# Patient Record
Sex: Female | Born: 1935 | Race: White | Hispanic: No | Marital: Married | Smoking: Never smoker
Health system: Southern US, Community
[De-identification: ages and names within clinical notes are randomized; demographics above are authoritative.]

## PROBLEM LIST (undated history)

## (undated) DIAGNOSIS — I10 Essential (primary) hypertension: Secondary | ICD-10-CM

## (undated) HISTORY — DX: Essential (primary) hypertension: I10

## (undated) HISTORY — PX: VAGINAL HYSTERECTOMY: SUR661

## (undated) HISTORY — PX: BREAST LUMPECTOMY: SHX2

## (undated) HISTORY — PX: TONSILLECTOMY: SUR1361

---

## 2007-03-12 ENCOUNTER — Ambulatory Visit: Payer: Self-pay | Admitting: Vascular Surgery

## 2007-03-25 ENCOUNTER — Ambulatory Visit: Payer: Self-pay | Admitting: Vascular Surgery

## 2007-06-17 ENCOUNTER — Ambulatory Visit: Payer: Self-pay | Admitting: Vascular Surgery

## 2007-09-09 ENCOUNTER — Ambulatory Visit: Payer: Self-pay | Admitting: Vascular Surgery

## 2008-02-18 ENCOUNTER — Ambulatory Visit: Payer: Self-pay | Admitting: Vascular Surgery

## 2011-04-01 NOTE — Procedures (Signed)
LOWER EXTREMITY VENOUS REFLUX EXAM   INDICATION:  Bilateral lower extremity varicose veins.  Patient has had  treatment for small spider veins in the past.   EXAM:  Using color-flow imaging and pulse Doppler spectral analysis, the  right and left common femoral, superficial femoral, popliteal, posterior  tibial, greater and lesser saphenous veins are evaluated.  There is no  evidence suggesting deep venous insufficiency in the right or left lower  extremity.   The right and left saphenofemoral junctions are competent.   The right and left GSV are competent.   The left proximal short saphenous vein demonstrates incompetency.  Diameter measurements range from 0.4 cm at the saphenopopliteal junction  to 0.24 cm at the mid-calf where the vessel sub-divides into very small  branches.   GSV Diameter (used if found to be incompetent only)                                            Right    Left  Proximal Greater Saphenous Vein           cm       cm  Proximal-to-mid-thigh                     cm       cm  Mid thigh                                 cm       cm  Mid-distal thigh                          cm       cm  Distal thigh                              cm       cm  Knee                                      cm       cm   IMPRESSION:  1. Left small saphenous vein reflux is identified with the caliber      ranging from 0.4 cm to 0.24 cm from the knee to the mid-calf.  2. The right and left greater saphenous veins are not aneurysmal.  3. The right and left greater saphenous veins are not tortuous.  4. The deep venous system is competent.  5. The left lesser saphenous vein is not competent.  6. There is a small incompetent tributary branch at the mid-thigh      level of the left greater saphenous vein.  The remaining left      greater saphenous vein proximal and distal to this incompetent      tributary branch are competent.   ___________________________________________  Larina Earthly, M.D.   MC/MEDQ  D:  02/18/2008  T:  02/18/2008  Job:  [id number]

## 2011-04-01 NOTE — Assessment & Plan Note (Signed)
OFFICE VISIT   ICYSS, SKOG  DOB:  03/12/1935                                       02/18/2008  [id number]#:[id number]   Patient presents today for continued discussion and concern regarding  symptoms in her right leg.  She reports a feeling of heaviness and  occasional tingling in the lateral calf.  She reports some sensation of  heaviness associated with this as well.  She does have known sciatic or  disk discomfort and is being treated with this.  Feels that it may be  related to this.  She has seen me today to rule out any venous  incompetence or venous hypertension that is causing her leg pain.  I  have seen her initially for consultation one year ago.  At that time, I  had done a cursory venous duplex, and this did not show any evidence of  obvious saphenous vein reflux.  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  She has not  had a good result from her sclerotherapy with some persistence of  treated veins and also some hemosiderin staining.  She has not had any  history of deep venous thrombosis or other major venous pathology.   She did undergo a formal duplex today, and this showed no evidence of  occlusion or incompetence in her deep system veins bilaterally.  Also,  her greater saphenous vein was normal without incompetence.  She did  have incompetence and reflux demonstrated in her left small saphenous  vein.  I discussed this with Ms. Calvillo and explained that she is not  having any symptoms related to this and therefore does not require any  treatment.  I am unclear of this etiology of her right leg symptoms but  are convinced that this is not related to venous pathology.  She has  normal 2+ dorsalis pedis pulses bilaterally.  I also explained this  indicates normal arterial flow.  She will see [place] again on an as-needed  basis.   Larina Earthly, M.D.  Electronically Signed   TFE/MEDQ  D:   02/18/2008  T:  02/21/2008  Job:  [id number]   cc:   Dr. Derald Macleod, Liz Malady

## 2014-02-22 ENCOUNTER — Other Ambulatory Visit: Payer: Self-pay | Admitting: Physical Medicine and Rehabilitation

## 2014-02-22 DIAGNOSIS — M549 Dorsalgia, unspecified: Secondary | ICD-10-CM

## 2014-02-23 ENCOUNTER — Ambulatory Visit
Admission: RE | Admit: 2014-02-23 | Discharge: 2014-02-23 | Disposition: A | Discharge status: Home / Self Care | Payer: Medicare Other | Source: Ambulatory Visit | Attending: Physical Medicine and Rehabilitation | Admitting: Physical Medicine and Rehabilitation

## 2014-02-23 VITALS — BP 155/66 | HR 75

## 2014-02-23 DIAGNOSIS — M549 Dorsalgia, unspecified: Secondary | ICD-10-CM

## 2014-02-23 DIAGNOSIS — M5126 Other intervertebral disc displacement, lumbar region: Secondary | ICD-10-CM

## 2014-02-23 MED ORDER — IOHEXOL 180 MG/ML  SOLN
1.0000 mL | Freq: Once | INTRAMUSCULAR | Status: AC | PRN
  Administered 2014-02-23: 1 mL via EPIDURAL

## 2014-02-23 MED ORDER — METHYLPREDNISOLONE ACETATE 40 MG/ML INJ SUSP (RADIOLOG
120.0000 mg | Freq: Once | INTRAMUSCULAR | Status: AC
  Administered 2014-02-23: 120 mg via EPIDURAL

## 2014-02-23 NOTE — Discharge Instructions (Signed)

## 2022-08-15 ENCOUNTER — Emergency Department (HOSPITAL_BASED_OUTPATIENT_CLINIC_OR_DEPARTMENT_OTHER)
Admission: EM | Admit: 2022-08-15 | Discharge: 2022-08-15 | Disposition: A | Discharge status: Home / Self Care | Payer: Medicare Other | Attending: Emergency Medicine | Admitting: Emergency Medicine

## 2022-08-15 ENCOUNTER — Emergency Department (HOSPITAL_BASED_OUTPATIENT_CLINIC_OR_DEPARTMENT_OTHER): Payer: Medicare Other | Admitting: Radiology

## 2022-08-15 ENCOUNTER — Encounter (HOSPITAL_BASED_OUTPATIENT_CLINIC_OR_DEPARTMENT_OTHER): Payer: Self-pay | Admitting: Emergency Medicine

## 2022-08-15 DIAGNOSIS — R531 Weakness: Secondary | ICD-10-CM | POA: Diagnosis not present

## 2022-08-15 DIAGNOSIS — R059 Cough, unspecified: Secondary | ICD-10-CM | POA: Diagnosis not present

## 2022-08-15 DIAGNOSIS — R5383 Other fatigue: Secondary | ICD-10-CM | POA: Diagnosis not present

## 2022-08-15 DIAGNOSIS — R42 Dizziness and giddiness: Secondary | ICD-10-CM | POA: Diagnosis not present

## 2022-08-15 LAB — BASIC METABOLIC PANEL
Anion gap: 11 (ref 5–15)
BUN: 16 mg/dL (ref 8–23)
CO2: 24 mmol/L (ref 22–32)
Calcium: 9.4 mg/dL (ref 8.9–10.3)
Chloride: 94 mmol/L — ABNORMAL LOW (ref 98–111)
Creatinine, Ser: 0.7 mg/dL (ref 0.44–1.00)
GFR, Estimated: >60 mL/min (ref >60)
Glucose, Bld: 108 mg/dL — ABNORMAL HIGH (ref 70–99)
Potassium: 3.5 mmol/L (ref 3.5–5.1)
Sodium: 129 mmol/L — ABNORMAL LOW (ref 135–145)

## 2022-08-15 LAB — CBC
HCT: 33.8 % — ABNORMAL LOW (ref 36.0–46.0)
Hemoglobin: 11.3 g/dL — ABNORMAL LOW (ref 12.0–15.0)
MCH: 29.8 pg (ref 26.0–34.0)
MCHC: 33.4 g/dL (ref 30.0–36.0)
MCV: 89.2 fL (ref 80.0–100.0)
Platelets: 291 10*3/uL (ref 150–400)
RBC: 3.79 MIL/uL — ABNORMAL LOW (ref 3.87–5.11)
RDW: 13.4 % (ref 11.5–15.5)
WBC: 6.8 10*3/uL (ref 4.0–10.5)
nRBC: 0 % (ref 0.0–0.2)

## 2022-08-15 LAB — URINALYSIS, ROUTINE W REFLEX MICROSCOPIC
Bilirubin Urine: NEGATIVE
Glucose, UA: NEGATIVE mg/dL
Hgb urine dipstick: NEGATIVE
Ketones, ur: NEGATIVE mg/dL
Nitrite: NEGATIVE
Protein, ur: NEGATIVE mg/dL
Specific Gravity, Urine: 1.01 (ref 1.005–1.030)
pH: 7 (ref 5.0–8.0)

## 2022-08-15 LAB — TROPONIN I (HIGH SENSITIVITY): Troponin I (High Sensitivity): 6 ng/L (ref <18)

## 2022-08-15 MED ORDER — SODIUM CHLORIDE 0.9 % IV BOLUS
500.0000 mL | Freq: Once | INTRAVENOUS | Status: AC
  Administered 2022-08-15: 500 mL via INTRAVENOUS

## 2022-08-15 MED ORDER — CEPHALEXIN 500 MG PO CAPS: 500.0000 mg | ORAL_CAPSULE | Freq: Two times a day (BID) | ORAL | 0 refills | Status: AC

## 2022-08-15 NOTE — ED Triage Notes (Signed)
Has been treated for UTI since Tuesday. C/o ongoing generalized weakness, decreased PO intake, bladder pressure.

## 2022-08-15 NOTE — ED Provider Notes (Signed)
[facility] EMERGENCY DEPT Provider Note   CSN: [id number] Arrival date & time: 08/15/22  1812     History  Chief Complaint  Patient presents with   Weakness    Terri Sellers is a 87 y.o. female.  HPI 87 year old female presents with progressive weakness and fatigue for about 3 weeks.  Earlier this week about 3 days ago she was put on Macrobid for a UTI.  She has had some nonspecific urinary symptoms and some bladder "pressure".  However her weakness and fatigue started before that.  She does describe a nonproductive cough but no shortness of breath, chest pain or headache.  No focal weakness but just feels tired.  Sometimes she feels lightheaded.  She has been taking the Macrobid but is concerned is not helping and she may need a different antibiotic.  Home Medications Prior to Admission medications   Medication Sig Start Date End Date Taking? Authorizing Provider  cephALEXin (KEFLEX) 500 MG capsule Take 1 capsule (500 mg total) by mouth 2 (two) times daily for 5 days. 08/15/22 08/20/22 Yes Pricilla Loveless, MD      Allergies    Patient has no known allergies.    Review of Systems   Review of Systems  Constitutional:  Positive for fatigue. Negative for fever.  Respiratory:  Positive for cough. Negative for shortness of breath.   Cardiovascular:  Negative for chest pain.  Gastrointestinal:  Negative for abdominal pain and vomiting.  Genitourinary:  Negative for dysuria.  Neurological:  Positive for weakness.    Physical Exam Updated Vital Signs BP 115/63   Pulse 80   Temp 98.2 F (36.8 C)   Resp 18   Ht 5\' 2"  (1.575 m)   Wt 58.1 kg   SpO2 100%   BMI 23.41 kg/m  Physical Exam Vitals and nursing note reviewed.  Constitutional:      Appearance: She is well-developed.  HENT:     Head: Normocephalic and atraumatic.  Eyes:     Extraocular Movements: Extraocular movements intact.     Pupils: Pupils are equal, round, and reactive to light.   Cardiovascular:     Rate and Rhythm: Normal rate and regular rhythm.     Heart sounds: Normal heart sounds.  Pulmonary:     Effort: Pulmonary effort is normal.     Breath sounds: Normal breath sounds. No wheezing, rhonchi or rales.  Abdominal:     General: There is no distension.     Palpations: Abdomen is soft.     Tenderness: There is no abdominal tenderness. There is no right CVA tenderness or left CVA tenderness.  Skin:    General: Skin is warm and dry.  Neurological:     Mental Status: She is alert.     Comments: CN 3-12 grossly intact. 5/5 strength in all 4 extremities. Grossly normal sensation.      ED Results / Procedures / Treatments   Labs (all labs ordered are listed, but only abnormal results are displayed) Labs Reviewed  BASIC METABOLIC PANEL - Abnormal; Notable for the following components:      Result Value   Sodium 129 (*)    Chloride 94 (*)    Glucose, Bld 108 (*)    All other components within normal limits  CBC - Abnormal; Notable for the following components:   RBC 3.79 (*)    Hemoglobin 11.3 (*)    HCT 33.8 (*)    All other components within normal limits  URINALYSIS, ROUTINE W REFLEX  MICROSCOPIC - Abnormal; Notable for the following components:   Leukocytes,Ua LARGE (*)    All other components within normal limits  URINE CULTURE  TROPONIN I (HIGH SENSITIVITY)    EKG EKG Interpretation  Date/Time:  Friday August 15 2022 18:51:19 EDT Ventricular Rate:  70 PR Interval:  168 QRS Duration: 74 QT Interval:  376 QTC Calculation: 406 R Axis:   44 Text Interpretation: Normal sinus rhythm Nonspecific ST abnormality No old tracing to compare Confirmed by Sherwood Gambler [id number]) on 08/15/2022 7:13:39 PM  Radiology DG Chest 2 View  Result Date: 08/15/2022 CLINICAL DATA:  Cough and weakness EXAM: CHEST - 2 VIEW COMPARISON:  None Available. FINDINGS: Normal cardiomediastinal silhouette. Aortic atherosclerotic calcification. No focal consolidation,  pleural effusion, or pneumothorax. Mild coarsening of the interstitium, favored chronic. No acute osseous abnormality. IMPRESSION: No active cardiopulmonary disease. Electronically Signed   By: Placido Sou M.D.   On: 08/15/2022 21:48    Procedures Procedures    Medications Ordered in ED Medications  sodium chloride 0.9 % bolus 500 mL (500 mLs Intravenous New Bag/Given 08/15/22 2153)    ED Course/ Medical Decision Making/ A&P                           Medical Decision Making Amount and/or Complexity of Data Reviewed External Data Reviewed: labs.    Details: UA with UTI but urine culture negative. Mild anemia on CBC, mild hyponatremia Labs: ordered.    Details: Mild anemia but not that different from a few days ago at PCP.  Mild hyponatremia that slightly worse than a couple days ago but not bad at 129. Radiology: ordered and independent interpretation performed.    Details: No pneumonia ECG/medicine tests: independent interpretation performed.    Details: Nonspecific ST waves but no old to compare to.  No acute ischemia.  Risk Prescription drug management.   Unclear why the patient has been progressively weak for several weeks.  Her sodium tends to run mildly low and is not that different today to make me suspect this is why she is feeling weak.  Could be urinary tract infection though its not been improved with Macrobid.  She would like to change to different antibiotics which I do not think is unreasonable.  She is feeling a little better with IV fluids.  We will try Keflex instead of Macrobid though with no CVA tenderness, normal WBC, my suspicion of pyelonephritis is pretty low.  I discussed that the urine may not be the cause of the weakness and she should still follow-up with PCP.  There is no concern for stroke on my exam given no unilateral weakness.  Her ECG is nonspecific but with no old to compare to.  Given age troponin sent but is negative.  Doubt this is from cardiac  cause.  No pneumonia/CHF on chest x-ray.  Will discharge home with return precautions.        Final Clinical Impression(s) / ED Diagnoses Final diagnoses:  Generalized weakness    Rx / DC Orders ED Discharge Orders          Ordered    cephALEXin (KEFLEX) 500 MG capsule  2 times daily        08/15/22 2320              Sherwood Gambler, MD 08/15/22 2349

## 2022-08-15 NOTE — ED Notes (Signed)
Pt care taken, resting awaiting md, did ambulate to bathroom, describes feeling weak this week since being diagnosed with an uti.

## 2022-08-15 NOTE — Discharge Instructions (Signed)
We are changing up your antibiotic as it may be that this is from a urinary tract infection.  Be sure to follow-up with her primary care physician next week as scheduled.  Follow-up new or worsening weakness, especially weakness on the one side, headache, chest pain, or any other new/concerning symptoms then return to the ER for eval patient.

## 2022-08-17 LAB — URINE CULTURE: Culture: NO GROWTH

## 2022-09-10 ENCOUNTER — Ambulatory Visit (INDEPENDENT_AMBULATORY_CARE_PROVIDER_SITE_OTHER): Payer: Medicare Other | Admitting: Neurology

## 2022-09-10 ENCOUNTER — Encounter: Payer: Self-pay | Admitting: Neurology

## 2022-09-10 VITALS — BP 142/78 | HR 80 | Ht 61.0 in | Wt 127.8 lb

## 2022-09-10 DIAGNOSIS — F419 Anxiety disorder, unspecified: Secondary | ICD-10-CM

## 2022-09-10 DIAGNOSIS — R208 Other disturbances of skin sensation: Secondary | ICD-10-CM | POA: Diagnosis not present

## 2022-09-10 DIAGNOSIS — G629 Polyneuropathy, unspecified: Secondary | ICD-10-CM

## 2022-09-10 MED ORDER — LIDOCAINE 5 % EX OINT: 1.0000 | TOPICAL_OINTMENT | CUTANEOUS | 3 refills | Status: AC | PRN

## 2022-09-10 MED ORDER — LAMOTRIGINE 25 MG PO TABS: ORAL_TABLET | ORAL | 3 refills | Status: AC

## 2022-09-10 NOTE — Progress Notes (Signed)
[facility]  PATIENT: Terri Sellers DOB: 12/22/1934  REFERRING DOCTOR OR PCP: Eber Hong, MD SOURCE: Patient, notes from primary care, laboratory tests  _________________________________   HISTORICAL  CHIEF COMPLAINT:  Chief Complaint  Patient presents with   Follow-up    RM 1 w/ family. Paper referral for neuropathy.     HISTORY OF PRESENT ILLNESS:  I had the pleasure to see your patient, Terri Sellers, at [facility] for neurologic consultation regarding her leg dysesthesias.  She is an 87 yo woman who has had numbness and pain in her feet the past few months.  She has had mild stingling in her feet that was tolerable the past 1-2 years.  Symptoms worsened 5-6 weeks ago with burning stinging dysesthesias.    These are worse at night than during the day and will go up to mid shin when more intense.      She noted some skin changes with bleeding 5-6 weeks ago and had an antibiotic called in.    She was referred to a vein specialist due to the blood spurt and a doppler ultrasound was ordered that was reportedly normal.   She also saw a dermatologist  and was told to use Eucerin lotion.     She feels her gait is unchanged but balance is a little worse.  Symptoms have not worsened further the last few weeks.    Gabapentin was poorly tolerated.   C[place] increased BP.     She has had dry eyes and dry mouth, worse this year.    She ha had a lot of anxiety and was started on buspirone.  Anxiety worsened in association with her leg pain.  . She reports her BP has been fluctuating more.  Labs 08/14/2022 was unremarkable excep low sodium 131.   B12 was normal.    She reports a lupus lab is pending.      REVIEW OF SYSTEMS: Constitutional: No fevers, chills, sweats, or change in appetite Eyes: No visual changes, double vision, eye pain Ear, nose and throat: No hearing loss, ear pain, nasal congestion, sore throat Cardiovascular: No chest pain,  palpitations Respiratory:  No shortness of breath at rest or with exertion.   No wheezes GastrointestinaI: No nausea, vomiting, diarrhea, abdominal pain, fecal incontinence Genitourinary:  No dysuria, urinary retention or frequency.  No nocturia. Musculoskeletal:  No neck pain, back pain Integumentary: No rash, pruritus, skin lesions Neurological: as above Psychiatric: No depression at this time.  No anxiety Endocrine: No palpitations, diaphoresis, change in appetite, change in weigh or increased thirst Hematologic/Lymphatic:  No anemia, purpura, petechiae. Allergic/Immunologic: No itchy/runny eyes, nasal congestion, recent allergic reactions, rashes  ALLERGIES: Allergies  Allergen Reactions   Latex Itching, Other (See Comments) and Rash   Duloxetine     Felt bad    Lisinopril Other (See Comments)    cough cough cough cough cough cough cough cough     HOME MEDICATIONS:  Current Outpatient Medications:    acetaminophen (TYLENOL) 500 MG tablet, Take 500 mg by mouth every 6 (six) hours as needed., Disp: , Rfl:    betamethasone dipropionate 0.05 % lotion, Apply topically 2 (two) times daily., Disp: , Rfl:    brimonidine (ALPHAGAN) 0.2 % ophthalmic solution, Place 1 drop into both eyes 3 (three) times daily., Disp: , Rfl:    busPIRone (BUSPAR) 5 MG tablet, Take 5 mg by mouth 3 (three) times daily., Disp: , Rfl:    Calcium Carbonate Antacid (CALCIUM CARBONATE PO),  Take 1 tablet by mouth daily., Disp: , Rfl:    calcium-vitamin D (OSCAL WITH D) 500-5 MG-MCG tablet, Take 1 tablet by mouth daily with breakfast., Disp: , Rfl:    diclofenac Sodium (VOLTAREN) 1 % GEL, Apply 1 Application topically as needed (For back pain)., Disp: , Rfl:    dorzolamide (TRUSOPT) 2 % ophthalmic solution, Place 1 drop into both eyes 3 (three) times daily., Disp: , Rfl:    Ergocalciferol (VITAMIN D2 PO), Take 50,000 Units by mouth once a week., Disp: , Rfl:    famotidine (PEPCID) 20 MG tablet, Take 20 mg by  mouth 2 (two) times daily., Disp: , Rfl:    fluticasone (FLONASE) 50 MCG/ACT nasal spray, Place 1 spray into both nostrils daily., Disp: , Rfl:    GLUCOSAMINE-CHONDROITIN PO, Take 1 Capful by mouth daily., Disp: , Rfl:    hydrochlorothiazide (HYDRODIURIL) 25 MG tablet, Take 25 mg by mouth daily., Disp: , Rfl:    ketoconazole (NIZORAL) 2 % cream, Apply 1 Application topically daily as needed for irritation., Disp: , Rfl:    lamoTRIgine (LAMICTAL) 25 MG tablet, Take 1 po qd x 5d, then 1 po bid x 5 d, then 1 po tid x 5 d, then 2 po bid, Disp: 120 tablet, Rfl: 3   latanoprost (XALATAN) 0.005 % ophthalmic solution, Place 1 drop into both eyes at bedtime., Disp: , Rfl:    lidocaine (XYLOCAINE) 5 % ointment, Apply 1 Application topically as needed. Rub one inch into the affected skin twice daily, Disp: 35.44 g, Rfl: 3   losartan (COZAAR) 25 MG tablet, Take 50 mg by mouth daily., Disp: , Rfl:    Multiple Vitamins-Minerals (PRESERVISION AREDS 2 PO), Take 1 tablet by mouth 2 (two) times daily., Disp: , Rfl:    nystatin ointment (MYCOSTATIN), Apply 1 Application topically 3 (three) times daily., Disp: , Rfl:    Omega-3 Fatty Acids (OMEGA 3 PO), Take 1 Capful by mouth daily. Salmon oil, Disp: , Rfl:    Polyethyl Glycol-Propyl Glycol (SYSTANE ULTRA OP), Apply 1 drop to eye in the morning and at bedtime., Disp: , Rfl:    POTASSIUM PO, Take 1 tablet by mouth daily., Disp: , Rfl:    pyridoxine (B-6) 100 MG tablet, Take 100 mg by mouth daily., Disp: , Rfl:    Red Yeast Rice Extract (RED YEAST RICE PO), Take 1 tablet by mouth daily., Disp: , Rfl:    triamcinolone lotion (KENALOG) 0.1 %, Apply 1 Application topically 3 (three) times daily., Disp: , Rfl:    Turmeric, Curcuma Longa, (TURMERIC ROOT) POWD, Take 1 capsule by mouth daily., Disp: , Rfl:   PAST MEDICAL HISTORY: Past Medical History:  Diagnosis Date   Hypertension     PAST SURGICAL HISTORY: Past Surgical History:  Procedure Laterality Date   BREAST  LUMPECTOMY Right    TONSILLECTOMY     VAGINAL HYSTERECTOMY      FAMILY HISTORY: Family History  Problem Relation Age of Onset   Lymphoma Mother    Heart attack Father     SOCIAL HISTORY: Social History   Socioeconomic History   Marital status: Married    Spouse name: Not on file   Number of children: Not on file   Years of education: Not on file   Highest education level: Not on file  Occupational History   Not on file  Tobacco Use   Smoking status: Never   Smokeless tobacco: Never  Substance and Sexual Activity   Alcohol use:  Never   Drug use: Never   Sexual activity: Not on file  Other Topics Concern   Not on file  Social History Narrative   Right handed   1/2 cup coffee per day, 1 soda per week, 8oz tea every day w/ dinner   Social Determinants of Health   Financial Resource Strain: Not on file  Food Insecurity: Not on file  Transportation Needs: Not on file  Physical Activity: Not on file  Stress: Not on file  Social Connections: Not on file  Intimate Partner Violence: Not on file       PHYSICAL EXAM  Vitals:   09/10/22 1358 09/10/22 1449  BP: (!) 171/82 (!) 142/78  Pulse: 80   Weight: 127 lb 12.8 oz (58 kg)   Height: '5\' 1"'  (1.549 m)     Body mass index is 24.15 kg/m.   General: The patient is well-developed and well-nourished and in no acute distress  HEENT:  Head is [place]/AT.  Sclera are anicteric.    Neck: No carotid bruits are noted.  The neck is nontender.  Cardiovascular: The heart has a regular rate and rhythm with a normal S1 and S2. There were no murmurs, gallops or rubs.    Skin: She has mild erythema in the legs.  No sores.  Musculoskeletal:  Back is nontender  Neurologic Exam  Mental status: The patient is alert and oriented x 3 at the time of the examination. The patient has apparent normal recent and remote memory, with an apparently normal attention span and concentration ability.   Speech is normal.  Cranial nerves:  Extraocular movements are full. Facial symmetry is present. There is good facial sensation to soft touch bilaterally.Facial strength is normal.  Trapezius and sternocleidomastoid strength is normal. No dysarthria is noted.  The tongue is midline, and the patient has symmetric elevation of the soft palate. No obvious hearing deficits are noted.  Motor:  Muscle bulk is normal.   Tone is normal. Strength is  5 / 5 in all 4 extremities.   Sensory: Sensory testing is intact to pinprick, soft touch and vibration sensation in the arms and proximal legs.  She had normal pinprick sensation except for the toe tips that were mildly reduced.  Vibration sensation was about 50% at the toes but normal at the ankles..  Coordination: Cerebellar testing reveals good finger-nose-finger and heel-to-shin bilaterally.  Gait and station: Station is normal.   Gait is normal. Tandem gait is normal. Romberg is negative.   Reflexes: Deep tendon reflexes are symmetric and normal bilaterally.   Plantar responses are flexor.    DIAGNOSTIC DATA (LABS, IMAGING, TESTING) - I reviewed patient records, labs, notes, testing and imaging myself where available.  Lab Results  Component Value Date   WBC 6.8 08/15/2022   HGB 11.3 (L) 08/15/2022   HCT 33.8 (L) 08/15/2022   MCV 89.2 08/15/2022   PLT 291 08/15/2022      Component Value Date/Time   NA 129 (L) 08/15/2022 1853   K 3.5 08/15/2022 1853   CL 94 (L) 08/15/2022 1853   CO2 24 08/15/2022 1853   GLUCOSE 108 (H) 08/15/2022 1853   BUN 16 08/15/2022 1853   CREATININE 0.70 08/15/2022 1853   CALCIUM 9.4 08/15/2022 1853   GFRNONAA >60 08/15/2022 1853        ASSESSMENT AND PLAN  Polyneuropathy - Plan: Sjogren's syndrome antibods(ssa + ssb), Multiple Myeloma Panel (SPEP&IFE w/QIG), Anti-Hu, Anti-Ri, Anti-Yo IFA, Rheumatoid factor, Copper, serum, CANCELED: Cyclic citrul  peptide antibody, IgG  Dysesthesia - Plan: Sjogren's syndrome antibods(ssa + ssb), Multiple  Myeloma Panel (SPEP&IFE w/QIG), Anti-Hu, Anti-Ri, Anti-Yo IFA, Rheumatoid factor, Copper, serum, CANCELED: Cyclic citrul peptide antibody, IgG  Anxiety  In summary, Ms. Jacuinde is an 87 year old woman with painful dysesthesias in both legs that have developed over the last 6 weeks.  On exam, she has just mild reduced vibration sensation and fairly intact pinprick sensation.  The etiology is not uncertain but she likely has a mild polyneuropathy.  We will check some blood work for autoimmune and metabolic causes of painful neuropathies.  To help with the dysesthesias, I will start lamotrigine and titrate up to 50 mg p.o. twice daily.  She also has anxiety and this may help a bit with that as well.    She will return to see [place] in 3 to 4 months or sooner if there are new or worsening neurologic symptoms or based on the results of the studies.  If symptoms do not improve and an etiology is not discovered with laboratory testing, we will arrange for her to have an NCV/EMG to further evaluate.  Thank you for asking me to see Ms. Darwin.  Please let me know if I can be of further assistance with her or other patients in the future.  Lukisha Procida A. Felecia Shelling, MD, [facility] 00/51/1021, 1:17 PM Certified in Neurology, Clinical Neurophysiology, Sleep Medicine and Neuroimaging  [facility] [address], [place], [place] [address] [phone]

## 2022-09-10 NOTE — Patient Instructions (Signed)
The pharmacy has the prescription of lamotrigine 25 mg.   Please take it as follows: For 5 days take 1 pill once a day. The next 5 days, take 1 pill twice a day. The next 5 days, take 1 pill in the morning and 2 at bedtime or evening. Then take 2 pills twice a day.  Most people tolerate lamotrigine very well.  If you get a rash stop the medicine immediately and let us know.

## 2022-09-11 ENCOUNTER — Other Ambulatory Visit (INDEPENDENT_AMBULATORY_CARE_PROVIDER_SITE_OTHER): Payer: Self-pay

## 2022-09-11 ENCOUNTER — Telehealth: Payer: Self-pay | Admitting: *Deleted

## 2022-09-11 DIAGNOSIS — Z0289 Encounter for other administrative examinations: Secondary | ICD-10-CM

## 2022-09-11 NOTE — Telephone Encounter (Signed)
PA approved 11/17/21-12/10/22.

## 2022-09-11 NOTE — Telephone Encounter (Signed)
PA lidocaine submitted on CMM. Key: [id number]. Waiting on determination from [facility].

## 2022-09-15 ENCOUNTER — Telehealth: Payer: Self-pay | Admitting: Neurology

## 2022-09-15 NOTE — Telephone Encounter (Signed)
Pt is calling. Stated she would like a nurse to call her with lab results. Pt is requesting a call back from nurse.

## 2022-09-15 NOTE — Telephone Encounter (Signed)
Called pt back. Advised a lot of her labs are still pending. Once all are back and MD reviews, we will call her. She verbalized understanding.

## 2022-09-22 LAB — MULTIPLE MYELOMA PANEL, SERUM
Albumin SerPl Elph-Mcnc: 3.9 g/dL (ref 2.9–4.4)
Albumin/Glob SerPl: 1.4 (ref 0.7–1.7)
Alpha 1: 0.3 g/dL (ref 0.0–0.4)
Alpha2 Glob SerPl Elph-Mcnc: 0.8 g/dL (ref 0.4–1.0)
B-Globulin SerPl Elph-Mcnc: 1.1 g/dL (ref 0.7–1.3)
Gamma Glob SerPl Elph-Mcnc: 0.8 g/dL (ref 0.4–1.8)
Globulin, Total: 3 g/dL (ref 2.2–3.9)
IgA/Immunoglobulin A, Serum: 231 mg/dL (ref 64–422)
IgG (Immunoglobin G), Serum: 923 mg/dL (ref 586–1602)
IgM (Immunoglobulin M), Srm: 52 mg/dL (ref 26–217)
Total Protein: 6.9 g/dL (ref 6.0–8.5)

## 2022-09-22 LAB — ANTI-HU, ANTI-RI, ANTI-YO IFA
Anti-Hu Ab: NEGATIVE
Anti-Ri Ab: NEGATIVE
Anti-Yo Ab: NEGATIVE

## 2022-09-22 LAB — RHEUMATOID FACTOR: Rheumatoid fact SerPl-aCnc: <10 IU/mL (ref <14.0)

## 2022-09-22 LAB — SJOGREN'S SYNDROME ANTIBODS(SSA + SSB)
ENA SSA (RO) Ab: <0.2 AI (ref 0.0–0.9)
ENA SSB (LA) Ab: <0.2 AI (ref 0.0–0.9)

## 2022-09-22 LAB — COPPER, SERUM: Copper: 65 ug/dL — ABNORMAL LOW (ref 80–158)

## 2022-09-23 ENCOUNTER — Telehealth: Payer: Self-pay | Admitting: Neurology

## 2022-09-23 NOTE — Telephone Encounter (Signed)
Called the patient and advised the pt of the lab results. Advised the copper level was low and that Dr Felecia Shelling would like for her to take 2 mg Copper twice a day. Advised if she has completed this for a month and the symptoms are not better she can let us know and we can consider next steps like MRI or ncv/emg. Pt asked a copy of this be sent to her PCP. Pt verbalized understanding. Pt had no questions at this time but was encouraged to call back if questions arise.

## 2022-09-23 NOTE — Telephone Encounter (Signed)
-----   Message from Britt Bottom, MD sent at 09/22/2022  4:50 PM EST ----- Copper was low.  She should take 2 mg of copper gluconate twice a day (should be able to get at drug stores or on [place]).  If not better after a month or so we would need to check an NCV/EMG and may be an MRI of the cervical spine

## 2022-09-24 ENCOUNTER — Other Ambulatory Visit: Payer: Self-pay | Admitting: Neurology

## 2022-09-24 ENCOUNTER — Telehealth: Payer: Self-pay | Admitting: Neurology

## 2022-09-24 MED ORDER — HYDROXYZINE HCL 10 MG PO TABS: 10.0000 mg | ORAL_TABLET | Freq: Three times a day (TID) | ORAL | 1 refills | Status: AC | PRN

## 2022-09-24 NOTE — Telephone Encounter (Signed)
Called pt. Relayed Dr. Bonnita Hollow recommendation. She is agreeable to try this. Went over instructions. She read back instructions correctly. Aware it was sent into [facility] for her. She will call back if this does not help.

## 2022-09-24 NOTE — Telephone Encounter (Signed)
Pt is asking for a lotion of some kind to be called in for the itching and burning of her legs and face

## 2022-09-24 NOTE — Telephone Encounter (Signed)
Called pt back. She is having severe itching/burning in her feet up to her knees, in her face and some in her arms.  She uses about 1in topically of the lidocaine ointment prescribed by Dr. Epimenio Foot on her toes/feet. Does not have enough to use on rest of body.  Face also has a rough texture to her skin.  Nose swollen/forehead puffy. She confirmed she ordered copper from [place] and should arrive today for her to start. She has also tried Sarna cream but ineffective. Trying cold compress/ineffective. Not able to sleep d/t pain. Dr. Maryellen Pile gave her lorazepam 0.5mg  that she takes at bedtime. But she says if she wakes up she cannot go back to sleep. She is supposed to move to [place] this Friday. Unsure if she can do this move if she continues feeling this way.  Aware I will send to MD to see if there is something else he can add to help with sx. Aware I will call back once I hear from Dr. Epimenio Foot.

## 2022-10-07 ENCOUNTER — Other Ambulatory Visit: Payer: Self-pay | Admitting: Neurology

## 2022-10-07 ENCOUNTER — Telehealth: Payer: Self-pay | Admitting: Neurology

## 2022-10-07 NOTE — Telephone Encounter (Signed)
Called pt back. Relayed Dr. Bonnita Hollow recommendation. She has already tried/failed gabapentin (poorly tolerated) and Cymbalta (increased BP). Yesterday, she started taking Buspirone 5mg  , 1 tablet po TID as needed for anxiety. She is not taking Sertraline 25mg . This made her too groggy/dizzy.  She wants to know if there is another topical cream she can use besides Sarna lotion for burning pain/stinging for legs to give copper more time to be effective over the next month.

## 2022-10-07 NOTE — Telephone Encounter (Signed)
Pt is calling. Stated the outside of her legs are burning and dry. Stated she needs something called in to the pharmacy. Pt is requesting a call back

## 2022-10-07 NOTE — Telephone Encounter (Signed)
Called and spoke w/ pt. From lower legs down to feet, she is having burning pain. Skin is very rough/dry. She is using Sarna lotion suggested by dermatologist but ineffective. She has a piece of skin peeling off. Also noticed red spots on lower legs.  Confirmed she is taking hydroxyzine 10mg , copper 2mg  (started this 09/26/22). Hydroxyzine only helped for first 2-3 days then sx returned. Wondering if there is something else that can help her sx or if Dr. thinks she should go back to dermatologist at this point?   Relayed it appeared Dr. 13/10/23 wanted her to try copper for at least a month. If sx no better at that point, may check MRI cervical or EMG/NCS. Pt states she would need sedation if MRI ordered. Very claustrophobic.

## 2022-10-07 NOTE — Telephone Encounter (Signed)
LVM for pt relaying Dr. Bonnita Hollow recommendation of benadryl ointment OTC. Asked her to call back if she has any further questions.

## 2022-10-07 NOTE — Telephone Encounter (Signed)
Per Dr. Epimenio Foot: " We can try gabapentin 100 mg #120    with 5 refills    1 p.o. every morning, 1 p.o. every afternoon and 2 p.o. nightly.    I called patient to discuss. No answer, left a message asking her to call us back.

## 2022-10-16 ENCOUNTER — Other Ambulatory Visit: Payer: Self-pay | Admitting: Neurology

## 2022-10-28 ENCOUNTER — Telehealth: Payer: Self-pay | Admitting: Neurology

## 2022-10-28 NOTE — Telephone Encounter (Signed)
Pt is calling. Stated she wanted to let Dr. Epimenio Foot know She started the pills on the Dec 10 th. Stated she is feeling strong and her legs are feeling better but still have some burning and tingling of the legs.

## 2022-10-28 NOTE — Telephone Encounter (Signed)
Called pt back to further discuss. Clarified with her that she started copper 2mg  po BID 09/26/22. Feeling stronger/legs healing. Still having some burning/tingling in in legs but they no longer keep her up at night. She does use OTC benadryl ointment intermittently. Taking Buspirone 5mg  , 1 tablet po TID as needed for anxiety. She will continue meds as prescribed. She will f/u at next appt 01/21/23 at 1pm. Walking better, no longer stumbling. Family has noticed improvement as well. She will hold off on EMG/NCS/further testing for now and will continue to monitor sx. If they worsen before appt, she will call back.   Per Dr. 09/22/22: "Copper was low.  She should take 2 mg of copper gluconate twice a day (should be able to get at drug stores or on [employer]).  If not better after a month or so we would need to check an NCV/EMG and may be an MRI of the cervical spine "

## 2023-01-21 ENCOUNTER — Ambulatory Visit (INDEPENDENT_AMBULATORY_CARE_PROVIDER_SITE_OTHER): Payer: Medicare Other | Admitting: Neurology

## 2023-01-21 ENCOUNTER — Encounter: Payer: Self-pay | Admitting: Neurology

## 2023-01-21 VITALS — BP 175/69 | HR 77 | Ht 62.0 in | Wt 128.2 lb

## 2023-01-21 DIAGNOSIS — G629 Polyneuropathy, unspecified: Secondary | ICD-10-CM

## 2023-01-21 DIAGNOSIS — F419 Anxiety disorder, unspecified: Secondary | ICD-10-CM

## 2023-01-21 DIAGNOSIS — R208 Other disturbances of skin sensation: Secondary | ICD-10-CM | POA: Diagnosis not present

## 2023-01-21 DIAGNOSIS — R790 Abnormal level of blood mineral: Secondary | ICD-10-CM | POA: Diagnosis not present

## 2023-01-21 NOTE — Progress Notes (Signed)
[facility]  PATIENT: Terri Sellers DOB: [birth year]  REFERRING DOCTOR OR PCP: Eber Hong, MD SOURCE: Patient, notes from primary care, laboratory tests  _________________________________   HISTORICAL  CHIEF COMPLAINT:  Chief Complaint  Patient presents with   Follow-up    Pt in room 10 here for polyneuropathy follow up. Pt reports doing well, feet does get cold at night,, no falls.     HISTORY OF PRESENT ILLNESS:  I had the pleasure to see your patient, Terri Sellers, at [facility] for neurologic consultation regarding her leg dysesthesias.  UPDATE 01/21/2023: After the last visit we checked labs and copper was low - she is supplementing (2 mg po bid).  She notes less numbness and pain is better with just mild tinging in legs.  She never started the lamotrigine.     Of note, she had been on zinc since 2020 due to Covid-19 and it was stopped.  We discussed that zinc may reduce copper absorption.     She feels anxiety is better since starting buspirone and she is also on lorazepam at night.  BP is elevated today and has been up/down a lot.    Mouth is less dry than it was.     Neuropathy history: She has had tingling in her feet starting in 2021.  Of note, she ws taking zinc daily since Covid started in 2020.    Symptoms worsened 5-6 weeks ago with burning stinging dysesthesias.    These are worse at night than during the day and will go up to mid shin when more intense.      She noted some skin changes with bleeding 5-6 weeks ago and had an antibiotic called in.    She was referred to a vein specialist due to the blood spurt and a doppler ultrasound was ordered that was reportedly normal.   She also saw a dermatologist  and was told to use Eucerin lotion.     She feels her gait is unchanged but balance is a little worse.  Symptoms have not worsened further the last few weeks.    Gabapentin was poorly tolerated.   Cymalta increased BP.     She  has had dry eyes and dry mouth, worse this year.    Labs 08/14/2022 was unremarkable excep low sodium 131.   B12 was normal.    She reports a lupus lab is pending.      REVIEW OF SYSTEMS: Constitutional: No fevers, chills, sweats, or change in appetite Eyes: No visual changes, double vision, eye pain Ear, nose and throat: No hearing loss, ear pain, nasal congestion, sore throat Cardiovascular: No chest pain, palpitations Respiratory:  No shortness of breath at rest or with exertion.   No wheezes GastrointestinaI: No nausea, vomiting, diarrhea, abdominal pain, fecal incontinence Genitourinary:  No dysuria, urinary retention or frequency.  No nocturia. Musculoskeletal:  No neck pain, back pain Integumentary: No rash, pruritus, skin lesions Neurological: as above Psychiatric: No depression at this time.  No anxiety Endocrine: No palpitations, diaphoresis, change in appetite, change in weigh or increased thirst Hematologic/Lymphatic:  No anemia, purpura, petechiae. Allergic/Immunologic: No itchy/runny eyes, nasal congestion, recent allergic reactions, rashes  ALLERGIES: Allergies  Allergen Reactions   Latex Itching, Other (See Comments) and Rash   Duloxetine     Felt bad    Lisinopril Other (See Comments)    cough cough cough cough cough cough cough cough     HOME MEDICATIONS:  Current Outpatient Medications:  acetaminophen (TYLENOL) 500 MG tablet, Take 500 mg by mouth every 6 (six) hours as needed., Disp: , Rfl:    betamethasone dipropionate 0.05 % lotion, Apply topically 2 (two) times daily., Disp: , Rfl:    brimonidine (ALPHAGAN) 0.2 % ophthalmic solution, Place 1 drop into both eyes 3 (three) times daily., Disp: , Rfl:    busPIRone (BUSPAR) 5 MG tablet, Take 5 mg by mouth 3 (three) times daily., Disp: , Rfl:    Calcium Carbonate Antacid (CALCIUM CARBONATE PO), Take 1 tablet by mouth daily., Disp: , Rfl:    calcium-vitamin D (OSCAL WITH D) 500-5 MG-MCG tablet, Take 1  tablet by mouth daily with breakfast., Disp: , Rfl:    diclofenac Sodium (VOLTAREN) 1 % GEL, Apply 1 Application topically as needed (For back pain)., Disp: , Rfl:    dorzolamide (TRUSOPT) 2 % ophthalmic solution, Place 1 drop into both eyes 3 (three) times daily., Disp: , Rfl:    Ergocalciferol (VITAMIN D2 PO), Take 50,000 Units by mouth once a week., Disp: , Rfl:    famotidine (PEPCID) 20 MG tablet, Take 20 mg by mouth 2 (two) times daily., Disp: , Rfl:    fluticasone (FLONASE) 50 MCG/ACT nasal spray, Place 1 spray into both nostrils daily., Disp: , Rfl:    GLUCOSAMINE-CHONDROITIN PO, Take 1 Capful by mouth daily., Disp: , Rfl:    hydrochlorothiazide (HYDRODIURIL) 25 MG tablet, Take 25 mg by mouth daily., Disp: , Rfl:    hydrOXYzine (ATARAX) 10 MG tablet, Take 1 tablet (10 mg total) by mouth 3 (three) times daily as needed., Disp: 90 tablet, Rfl: 1   ketoconazole (NIZORAL) 2 % cream, Apply 1 Application topically daily as needed for irritation., Disp: , Rfl:    lamoTRIgine (LAMICTAL) 25 MG tablet, Take 1 po qd x 5d, then 1 po bid x 5 d, then 1 po tid x 5 d, then 2 po bid, Disp: 120 tablet, Rfl: 3   latanoprost (XALATAN) 0.005 % ophthalmic solution, Place 1 drop into both eyes at bedtime., Disp: , Rfl:    lidocaine (XYLOCAINE) 5 % ointment, Apply 1 Application topically as needed. Rub one inch into the affected skin twice daily, Disp: 35.44 g, Rfl: 3   LORazepam (ATIVAN) 0.5 MG tablet, Take 0.5 mg by mouth daily as needed., Disp: , Rfl:    losartan (COZAAR) 25 MG tablet, Take 50 mg by mouth daily., Disp: , Rfl:    Multiple Vitamins-Minerals (PRESERVISION AREDS 2 PO), Take 1 tablet by mouth 2 (two) times daily., Disp: , Rfl:    nystatin ointment (MYCOSTATIN), Apply 1 Application topically 3 (three) times daily., Disp: , Rfl:    Omega-3 Fatty Acids (OMEGA 3 PO), Take 1 Capful by mouth daily. Salmon oil, Disp: , Rfl:    Polyethyl Glycol-Propyl Glycol (SYSTANE ULTRA OP), Apply 1 drop to eye in the  morning and at bedtime., Disp: , Rfl:    POTASSIUM PO, Take 1 tablet by mouth daily., Disp: , Rfl:    pyridoxine (B-6) 100 MG tablet, Take 100 mg by mouth daily., Disp: , Rfl:    Red Yeast Rice Extract (RED YEAST RICE PO), Take 1 tablet by mouth daily., Disp: , Rfl:    triamcinolone lotion (KENALOG) 0.1 %, Apply 1 Application topically 3 (three) times daily., Disp: , Rfl:    Turmeric, Curcuma Longa, (TURMERIC ROOT) POWD, Take 1 capsule by mouth daily., Disp: , Rfl:   PAST MEDICAL HISTORY: Past Medical History:  Diagnosis Date   Hypertension  PAST SURGICAL HISTORY: Past Surgical History:  Procedure Laterality Date   BREAST LUMPECTOMY Right    TONSILLECTOMY     VAGINAL HYSTERECTOMY      FAMILY HISTORY: Family History  Problem Relation Age of Onset   Lymphoma Mother    Heart attack Father     SOCIAL HISTORY: Social History   Socioeconomic History   Marital status: Married    Spouse name: Not on file   Number of children: Not on file   Years of education: Not on file   Highest education level: Not on file  Occupational History   Not on file  Tobacco Use   Smoking status: Never   Smokeless tobacco: Never  Substance and Sexual Activity   Alcohol use: Never   Drug use: Never   Sexual activity: Not on file  Other Topics Concern   Not on file  Social History Narrative   Right handed   1/2 cup coffee per day, 1 soda per week, 8oz tea every day w/ dinner   Social Determinants of Health   Financial Resource Strain: Not on file  Food Insecurity: Not on file  Transportation Needs: Not on file  Physical Activity: Not on file  Stress: Not on file  Social Connections: Not on file  Intimate Partner Violence: Not on file       PHYSICAL EXAM  Vitals:   01/21/23 1258 01/21/23 1304  BP: (!) 189/63 (!) 175/69  Pulse: 70 77  Weight: 128 lb 3.2 oz (58.2 kg)   Height: '5\' 2"'$  (1.575 m)     Body mass index is 23.45 kg/m.   General: The patient is well-developed  and well-nourished and in no acute distress  HEENT:  Head is [place]/AT.  Sclera are anicteric.     Skin: She has mild erythema in the legs.  No sores.  Musculoskeletal:  Back is nontender  Neurologic Exam  Mental status: The patient is alert and oriented x 3 at the time of the examination. The patient has apparent normal recent and remote memory, with an apparently normal attention span and concentration ability.   Speech is normal.  Cranial nerves: Extraocular movements are full.  Facial strength and sensation was normal.  No obvious hearing deficits are noted.  Motor:  Muscle bulk is normal.   Tone is normal. Strength is  5 / 5 in all 4 extremities.   Sensory: Sensory testing is intact to pinprick, soft touch and vibration sensation in the arms and proximal legs.  She had normal pinprick sensation including toes  Vibration sensation was about 50-60% at the toes but normal at the ankles..  Coordination: Cerebellar testing reveals good finger-nose-finger and heel-to-shin bilaterally.  Gait and station: Station is normal.   Gait is normal. Tandem gait is normal. Romberg is negative.   Reflexes: Deep tendon reflexes are symmetric and normal bilaterally.        DIAGNOSTIC DATA (LABS, IMAGING, TESTING) - I reviewed patient records, labs, notes, testing and imaging myself where available.  Lab Results  Component Value Date   WBC 6.8 08/15/2022   HGB 11.3 (L) 08/15/2022   HCT 33.8 (L) 08/15/2022   MCV 89.2 08/15/2022   PLT 291 08/15/2022      Component Value Date/Time   NA 129 (L) 08/15/2022 1853   K 3.5 08/15/2022 1853   CL 94 (L) 08/15/2022 1853   CO2 24 08/15/2022 1853   GLUCOSE 108 (H) 08/15/2022 1853   BUN 16 08/15/2022 1853   CREATININE  0.70 08/15/2022 1853   CALCIUM 9.4 08/15/2022 1853   PROT 6.9 09/11/2022 1200   GFRNONAA >60 08/15/2022 1853        ASSESSMENT AND PLAN  Polyneuropathy - Plan: Copper, serum  Low serum copper level - Plan: Copper,  serum  Dysesthesia  Anxiety  Check copper today and reduce to once a day (currently on 2 mg twice a day) if in normal range. BP was elevated and she is advised to check with primary care to see if any medication should be adjusted. Anxiety has done better on buspirone and she will continue Rtc as needed  Annelies Coyt A. Felecia Shelling, MD, [facility] [id number], [id number] PM Certified in Neurology, Clinical Neurophysiology, Sleep Medicine and Neuroimaging  [facility] [address], [place], [place] [address] [phone]

## 2023-01-24 LAB — COPPER, SERUM: Copper: 103 ug/dL (ref 80–158)

## 2023-01-26 ENCOUNTER — Telehealth: Payer: Self-pay | Admitting: *Deleted

## 2023-01-26 NOTE — Telephone Encounter (Signed)
-----   Message from Britt Bottom, MD sent at 01/24/2023  2:13 PM EST ----- Please let the patient know that the copper levl lab work is fine.

## 2023-01-26 NOTE — Telephone Encounter (Signed)
Called and spoke w/ pt. Relayed results per Dr. Garth Bigness note. Pt verbalized understanding. She will continue f/u with PCP and have PCP monitor copper levels moving forward.  Per last OV note: "Check copper today and reduce to once a day (currently on 2 mg twice a day) if in normal range."

## 2023-07-14 ENCOUNTER — Other Ambulatory Visit: Payer: Self-pay | Admitting: *Deleted

## 2023-07-14 DIAGNOSIS — I83893 Varicose veins of bilateral lower extremities with other complications: Secondary | ICD-10-CM

## 2023-07-15 ENCOUNTER — Ambulatory Visit: Payer: Medicare Other

## 2023-07-15 ENCOUNTER — Ambulatory Visit (HOSPITAL_COMMUNITY)
Admission: RE | Admit: 2023-07-15 | Discharge: 2023-07-15 | Disposition: A | Discharge status: Home / Self Care | Payer: Medicare Other | Source: Ambulatory Visit | Attending: Vascular Surgery | Admitting: Vascular Surgery

## 2023-07-15 VITALS — BP 155/70 | HR 76 | Temp 98.4°F

## 2023-07-15 DIAGNOSIS — I83893 Varicose veins of bilateral lower extremities with other complications: Secondary | ICD-10-CM | POA: Insufficient documentation

## 2023-07-15 DIAGNOSIS — I872 Venous insufficiency (chronic) (peripheral): Secondary | ICD-10-CM

## 2023-07-15 DIAGNOSIS — G629 Polyneuropathy, unspecified: Secondary | ICD-10-CM | POA: Diagnosis not present

## 2023-07-15 DIAGNOSIS — I8393 Asymptomatic varicose veins of bilateral lower extremities: Secondary | ICD-10-CM

## 2023-07-15 NOTE — Progress Notes (Addendum)
Office Note     CC:  follow up Requesting Provider:  Kathlee Nations, MD  HPI: Terri Sellers is a 88 y.o. (Apr 14, 1935) female who presents for evaluation of severe burning, pins-and-needles, stabbing pain in both lower legs.  She has experienced this over the past several months.  She also has numbness in her toes.  She has been diagnosed with neuropathy however believes this is unrelated.  She also has had 3 bleeding episodes from a superficial vein at her left lateral ankle.  She denies any history of DVT, venous ulcerations, trauma, or prior vascular interventions.  She does not wear compression however has worn them in the past when she had sclerotherapy years ago.  She does not elevate her legs much during the day.  She denies tobacco use.  She does not take any medication to help with neuropathy.   Past Medical History:  Diagnosis Date   Hypertension     Past Surgical History:  Procedure Laterality Date   BREAST LUMPECTOMY Right    TONSILLECTOMY     VAGINAL HYSTERECTOMY      Social History   Socioeconomic History   Marital status: Married    Spouse name: Not on file   Number of children: Not on file   Years of education: Not on file   Highest education level: Not on file  Occupational History   Not on file  Tobacco Use   Smoking status: Never   Smokeless tobacco: Never  Substance and Sexual Activity   Alcohol use: Never   Drug use: Never   Sexual activity: Not on file  Other Topics Concern   Not on file  Social History Narrative   Right handed   1/2 cup coffee per day, 1 soda per week, 8oz tea every day w/ dinner   Social Determinants of Health   Financial Resource Strain: Low Risk  (12/02/2022)   Received from [facility]   Overall Financial Resource Strain (CARDIA)    Difficulty of Paying Living Expenses: Not hard at all  Food Insecurity: No Food Insecurity (12/02/2022)   Received from [facility], [facility]   Hunger Vital Sign    Worried  About Running Out of Food in the Last Year: Never true    Ran Out of Food in the Last Year: Never true  Transportation Needs: No Transportation Needs (12/02/2022)   Received from [employer], [facility]   PRAPARE - Transportation    Lack of Transportation (Medical): No    Lack of Transportation (Non-Medical): No  Physical Activity: Not on file  Stress: Not on file  Social Connections: Unknown (03/31/2022)   Received from [facility], [facility]   Social Network    Social Network: Not on file  Intimate Partner Violence: Unknown (02/20/2022)   Received from [facility], [facility]   HITS    Physically Hurt: Not on file    Insult or Talk Down To: Not on file    Threaten Physical Harm: Not on file    Scream or Curse: Not on file    Family History  Problem Relation Age of Onset   Lymphoma Mother    Heart attack Father     Current Outpatient Medications  Medication Sig Dispense Refill   acetaminophen (TYLENOL) 500 MG tablet Take 500 mg by mouth every 6 (six) hours as needed.     betamethasone dipropionate 0.05 % lotion Apply topically 2 (two) times daily.     brimonidine (ALPHAGAN) 0.2 %  ophthalmic solution Place 1 drop into both eyes 3 (three) times daily.     busPIRone (BUSPAR) 5 MG tablet Take 5 mg by mouth 3 (three) times daily.     Calcium Carbonate Antacid (CALCIUM CARBONATE PO) Take 1 tablet by mouth daily.     calcium-vitamin D (OSCAL WITH D) 500-5 MG-MCG tablet Take 1 tablet by mouth daily with breakfast.     diclofenac Sodium (VOLTAREN) 1 % GEL Apply 1 Application topically as needed (For back pain).     dorzolamide (TRUSOPT) 2 % ophthalmic solution Place 1 drop into both eyes 3 (three) times daily.     Ergocalciferol (VITAMIN D2 PO) Take 50,000 Units by mouth once a week.     famotidine (PEPCID) 20 MG tablet Take 20 mg by mouth 2 (two) times daily.     fluticasone (FLONASE) 50 MCG/ACT nasal spray Place 1 spray into both nostrils daily.      GLUCOSAMINE-CHONDROITIN PO Take 1 Capful by mouth daily.     hydrochlorothiazide (HYDRODIURIL) 25 MG tablet Take 25 mg by mouth daily.     hydrOXYzine (ATARAX) 10 MG tablet Take 1 tablet (10 mg total) by mouth 3 (three) times daily as needed. 90 tablet 1   ketoconazole (NIZORAL) 2 % cream Apply 1 Application topically daily as needed for irritation.     lamoTRIgine (LAMICTAL) 25 MG tablet Take 1 po qd x 5d, then 1 po bid x 5 d, then 1 po tid x 5 d, then 2 po bid 120 tablet 3   latanoprost (XALATAN) 0.005 % ophthalmic solution Place 1 drop into both eyes at bedtime.     lidocaine (XYLOCAINE) 5 % ointment Apply 1 Application topically as needed. Rub one inch into the affected skin twice daily 35.44 g 3   LORazepam (ATIVAN) 0.5 MG tablet Take 0.5 mg by mouth daily as needed.     losartan (COZAAR) 25 MG tablet Take 50 mg by mouth daily.     Multiple Vitamins-Minerals (PRESERVISION AREDS 2 PO) Take 1 tablet by mouth 2 (two) times daily.     nystatin ointment (MYCOSTATIN) Apply 1 Application topically 3 (three) times daily.     Omega-3 Fatty Acids (OMEGA 3 PO) Take 1 Capful by mouth daily. Salmon oil     Polyethyl Glycol-Propyl Glycol (SYSTANE ULTRA OP) Apply 1 drop to eye in the morning and at bedtime.     POTASSIUM PO Take 1 tablet by mouth daily.     pyridoxine (B-6) 100 MG tablet Take 100 mg by mouth daily.     Red Yeast Rice Extract (RED YEAST RICE PO) Take 1 tablet by mouth daily.     triamcinolone lotion (KENALOG) 0.1 % Apply 1 Application topically 3 (three) times daily.     Turmeric, Curcuma Longa, (TURMERIC ROOT) POWD Take 1 capsule by mouth daily.     No current facility-administered medications for this visit.    Allergies  Allergen Reactions   Latex Itching, Other (See Comments) and Rash   Duloxetine     Felt bad    Lisinopril Other (See Comments)    cough cough cough cough cough cough cough cough      REVIEW OF SYSTEMS:   [X]  denotes positive finding, [ ]  denotes  negative finding Cardiac  Comments:  Chest pain or chest pressure:    Shortness of breath upon exertion:    Short of breath when lying flat:    Irregular heart rhythm:        Vascular  Pain in calf, thigh, or hip brought on by ambulation:    Pain in feet at night that wakes you up from your sleep:     Blood clot in your veins:    Leg swelling:         Pulmonary    Oxygen at home:    Productive cough:     Wheezing:         Neurologic    Sudden weakness in arms or legs:     Sudden numbness in arms or legs:     Sudden onset of difficulty speaking or slurred speech:    Temporary loss of vision in one eye:     Problems with dizziness:         Gastrointestinal    Blood in stool:     Vomited blood:         Genitourinary    Burning when urinating:     Blood in urine:        Psychiatric    Major depression:         Hematologic    Bleeding problems:    Problems with blood clotting too easily:        Skin    Rashes or ulcers:        Constitutional    Fever or chills:      PHYSICAL EXAMINATION:  Vitals:   07/15/23 1025  BP: (!) 155/70  Pulse: 76  Temp: 98.4 F (36.9 C)  TempSrc: Temporal  SpO2: 98%    General:  WDWN in NAD; vital signs documented above Gait: Not observed HENT: WNL, normocephalic Pulmonary: normal non-labored breathing , without Rales, rhonchi,  wheezing Cardiac: regular HR Abdomen: soft, NT, no masses Skin: without rashes Vascular Exam/Pulses: palpable DP pulses BLE Extremities: Varicosities and spider veins scattered bilateral lower legs; hyperpigmentation changes to both lower legs; no weeping or open wounds Musculoskeletal: no muscle wasting or atrophy  Neurologic: A&O X 3 Psychiatric:  The pt has Normal affect.   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  GSV was  not well-visualized at the knee and proximal calf Also with an incompetent small saphenous and accessory saphenous   ASSESSMENT/PLAN:: 88 y.o. female here for evaluation of bleeding surface vein in the left ankle.  She however is more concerned about burning, tenderness, stabbing, pins-and-needles feeling in both legs as well as numbness in her feet  -Patient does have venous insufficiency based on venous reflux study as well as physical exam.  She has hyperpigmentation changes of the skin of both legs as well as spider veins and varicose veins of both lower legs.  Patient is however complaining of burning, tenderness to touch, and stabbing pins-and-needles feeling in both legs.  Etiology of the symptoms are unlikely venous insufficiency.  Given that she has had 3 episodes of bleeding from a lateral ankle superficial vein she will also need treatment for insufficiency.  We will try knee-high 15 to 20 mmHg compression stockings on both legs to see if this relieves her symptoms.  We also discussed proper leg elevation above the level of her heart which should be performed periodically throughout the day.  She will also try to avoid prolonged sitting and standing.  If this relieves her neuropathy symptoms, we can increase compression to thigh-high 20 to 30 mmHg and  reevaluate her after 3 months of conservative therapy to see if she would be a candidate for greater saphenous vein ablation.  For now she will follow-up on an as-needed basis.  ADDENDUM 07/30/23: Ms. Parrado continues to have ongoing burning and pins-and-needles feeling in both feet.  I emphasized during her office visit that venous insufficiency does not cause the symptoms.  Reflux study was reviewed with our vein center and she is not considered to be a candidate for laser ablation therapy.  She should continue to wear knee-high compression, elevate her legs above the level of her heart periodically through the day, and avoid prolonged sitting and  standing.  She will need to follow-up with her PCP for further workup of the etiology of her symptoms.  She also expresses concern for bleeding superficial veins.  We can offer her sclerotherapy to address a superficial vein with prior episodes of bleeding.  This however will not address her main concern of pins-and-needles and pain in both feet.  We will ask Cherene Julian, RN our sclerotherapy nurse to evaluate the patient for potential sclerotherapy.  She will otherwise need to follow-up with her PCP for management of her primary concern of neuropathic symptoms in her feet.   Emilie Rutter, PA-C [facility] [phone]  Clinic MD:   Randie Heinz

## 2023-07-17 DIAGNOSIS — M7989 Other specified soft tissue disorders: Secondary | ICD-10-CM

## 2023-07-30 ENCOUNTER — Telehealth: Payer: Self-pay | Admitting: *Deleted

## 2023-07-30 NOTE — Telephone Encounter (Signed)
Returning Terri Sellers voice message from yesterday.  Terri Sellers was seen by Terri Moats PA on 07-15-2023. Reviewed her office notes and venous reflux study with Terri to advise Terri Sellers on her bilateral lower extremity pain.  Terri Moats PA stated Terri Sellers symptoms were not related to venous insufficieny and she was not a candidate for laser ablation because her greater saphenous and small saphenous veins were too small in diameter.  Terri Sellers stated sclerotherapy of veins around left lateral ankle that have bled could be treated.  Terri Sellers states she would like to move forward with sclerotherapy. Will have Terri Julian RN call her and set up appointment for sclerotherapy.

## 2023-07-31 ENCOUNTER — Telehealth: Payer: Self-pay

## 2023-07-31 NOTE — Telephone Encounter (Signed)
Returned pt's call regarding her questions about sclerotherapy. After much discussion, she is going to call us should she experience another bleed from her lower leg vein. If she does, we can schedule sclerotherapy. All questions answered.

## 2023-08-16 ENCOUNTER — Other Ambulatory Visit: Payer: Self-pay | Admitting: Neurology

## 2023-08-17 NOTE — Telephone Encounter (Signed)
Last seen on 01/21/23 No follow up scheduled  I called pharmacy and they have refill there, this request denied.

## 2023-08-20 ENCOUNTER — Encounter (HOSPITAL_BASED_OUTPATIENT_CLINIC_OR_DEPARTMENT_OTHER): Payer: Medicare Other | Attending: Internal Medicine | Admitting: Internal Medicine

## 2023-08-20 DIAGNOSIS — Z9221 Personal history of antineoplastic chemotherapy: Secondary | ICD-10-CM | POA: Diagnosis not present

## 2023-08-20 DIAGNOSIS — I10 Essential (primary) hypertension: Secondary | ICD-10-CM | POA: Insufficient documentation

## 2023-08-20 DIAGNOSIS — I872 Venous insufficiency (chronic) (peripheral): Secondary | ICD-10-CM | POA: Insufficient documentation

## 2023-08-20 DIAGNOSIS — M79669 Pain in unspecified lower leg: Secondary | ICD-10-CM | POA: Insufficient documentation

## 2023-08-20 DIAGNOSIS — I87313 Chronic venous hypertension (idiopathic) with ulcer of bilateral lower extremity: Secondary | ICD-10-CM | POA: Insufficient documentation

## 2023-08-20 DIAGNOSIS — Z923 Personal history of irradiation: Secondary | ICD-10-CM | POA: Insufficient documentation

## 2023-08-20 NOTE — Progress Notes (Signed)
Terri Sellers, Terri Sellers ([id number]) [id number] Page 1 of 4 Visit Report for 08/20/2023 Abuse Risk Screen Details Patient Name: Date of Service: Terri Terri Sellers, Terri Sellers 08/20/2023 10:30 A M Medical Record Number: [id number] Patient Account Number: [id number] Date of Birth/Sex: Treating RN: [birth year] (88 y.o. Terri Sellers Primary Care Terri Sellers: Terri Sellers Other Clinician: Referring Terri Sellers: Treating Terri Sellers/Extender: Terri Sellers in Treatment: 0 Abuse Risk Screen Items Answer ABUSE RISK SCREEN: Has anyone close to you tried to hurt or harm you recentlyo No Do you feel uncomfortable with anyone in your familyo No Has anyone forced you do things that you didnt want to doo No Electronic Signature(s) Signed: 08/20/2023 4:51:22 PM By: Terri Pulling RN, BSN Entered By: Terri Sellers on 08/20/2023 08:02:48 -------------------------------------------------------------------------------- Activities of Daily Living Details Patient Name: Date of Service: Terri Sellers, Terri Sellers 08/20/2023 10:30 A M Medical Record Number: [id number] Patient Account Number: [id number] Date of Birth/Sex: Treating RN: S[birth year] (88 y.o. Terri Sellers Primary Care Terri Sellers: Terri Sellers Other Clinician: Referring Terri Sellers: Treating Terri Sellers/Extender: Terri Sellers in Treatment: 0 Activities of Daily Living Items Answer Activities of Daily Living (Please select one for each item) Drive Automobile Completely Able T Medications ake Completely Able Use T elephone Completely Able Care for Appearance Completely Able Use T oilet Completely Able Bath / Shower Completely Able Dress Self Completely Able Feed Self Completely Able Walk Completely Able Get In / Out Bed Completely Able Housework Completely Able Prepare Meals Completely Able Handle Money Completely Able Shop for Self Completely Able Electronic Signature(s) Signed: 08/20/2023 4:51:22 PM By:  Terri Pulling RN, BSN Entered By: Terri Sellers on 08/20/2023 08:03:09 [name], [name] ([id number]) [id number] Page 2 of 4 -------------------------------------------------------------------------------- Education Screening Details Patient Name: Date of Service: Terri Terri Sellers, Terri Sellers 08/20/2023 10:30 A M Medical Record Number: [id number] Patient Account Number: [id number] Date of Birth/Sex: Treating RN: Terri Sellers (88 y.o. Terri Sellers Primary Care Terri Sellers: Terri Sellers Other Clinician: Referring Terri Sellers: Treating Terri Sellers/Extender: Terri Sellers in Treatment: 0 Learning Preferences/Education Level/Primary Language Learning Preference: Explanation, Demonstration, Printed Material Preferred Language: English Cognitive Barrier Language Barrier: No Translator Needed: No Memory Deficit: No Emotional Barrier: No Cultural/Religious Beliefs Affecting Medical Care: No Physical Barrier Impaired Vision: No Impaired Hearing: No Decreased Hand dexterity: No Knowledge/Comprehension Knowledge Level: High Comprehension Level: High Ability to understand written instructions: High Ability to understand verbal instructions: High Motivation Anxiety Level: Calm Cooperation: Cooperative Education Importance: Acknowledges Need Interest in Health Problems: Asks Questions Perception: Coherent Willingness to Engage in Self-Management High Activities: Readiness to Engage in Self-Management High Activities: Electronic Signature(s) Signed: 08/20/2023 4:51:22 PM By: Terri Pulling RN, BSN Entered By: Terri Sellers on 08/20/2023 08:03:50 -------------------------------------------------------------------------------- Fall Risk Assessment Details Patient Name: Date of Service: Terri Sellers, Terri Sellers 08/20/2023 10:30 A M Medical Record Number: [id number] Patient Account Number: [id number] Date of Birth/Sex: Treating RN: [birth year] (88 y.o. Terri Sellers Primary Care Terri Sellers: Terri Sellers Other Clinician: Referring Terri Sellers: Treating Terri Sellers/Extender: Terri Sellers in Treatment: 0 Fall Risk Assessment Items Have you had 2 or more falls in the last 12 monthso 0 No Have you had any fall that resulted in injury in the last 12 monthso 0 No FALLS RISK SCREEN Terri Sellers, Terri Sellers ([id number]) [id number] Page 3 of 4 History of falling - immediate or within 3 months 0 No Secondary diagnosis (Do you have 2 or more medical diagnoseso) 0 No Ambulatory aid None/bed rest/wheelchair/nurse 0 Yes Crutches/cane/walker 0 No Furniture 0 No Intravenous therapy Access/Saline/Heparin  Lock 0 No Gait/Transferring Normal/ bed rest/ wheelchair 0 Yes Weak (short steps with or without shuffle, stooped but able to lift head while walking, may seek 0 No support from furniture) Impaired (short steps with shuffle, may have difficulty arising from chair, head down, impaired 0 No balance) Mental Status Oriented to own ability 0 Yes Electronic Signature(s) Signed: 08/20/2023 4:51:22 PM By: Terri Pulling RN, BSN Entered By: Terri Sellers on 08/20/2023 08:04:36 -------------------------------------------------------------------------------- Foot Assessment Details Patient Name: Date of Service: Terri Sellers, Terri Sellers 08/20/2023 10:30 A M Medical Record Number: [id number] Patient Account Number: [id number] Date of Birth/Sex: Treating RN: [birth year] (88 y.o. Terri Sellers Primary Care Terri Sellers: Terri Sellers Other Clinician: Referring Terri Sellers: Treating Terri Sellers/Extender: Terri Sellers in Treatment: 0 Foot Assessment Items Site Locations + = Sensation present, - = Sensation absent, C = Callus, U = Ulcer R = Redness, W = Warmth, M = Maceration, PU = Pre-ulcerative lesion F = Fissure, S = Swelling, D = Dryness Assessment Right: Left: Other Deformity: No No Prior Foot Ulcer: No No Prior  Amputation: No No Charcot Joint: No No Ambulatory Status: Ambulatory Without Help GaitBrihany Sellers, Terri Sellers ([id number]) [phone] [id number] Page 4 of 4 Electronic Signature(s) Signed: 08/20/2023 4:51:22 PM By: Terri Pulling RN, BSN Entered By: Terri Sellers on 08/20/2023 08:07:18 -------------------------------------------------------------------------------- Nutrition Risk Screening Details Patient Name: Date of Service: Terri Sellers, Terri Sellers 08/20/2023 10:30 A M Medical Record Number: [id number] Patient Account Number: [id number] Date of Birth/Sex: Treating RN: [birth year] Sellers (88 y.o. Terri Sellers Primary Care Almira [place]: Terri Sellers Other Clinician: Referring Becca Bayne: Treating Eileen Kangas/Extender: Terri Sellers in Treatment: 0 Height (in): Weight (lbs): Body Mass Index (BMI): Nutrition Risk Screening Items Score Screening NUTRITION RISK SCREEN: I have an illness or condition that made me change the kind and/or amount of food I eat 0 No I eat fewer than two meals per day 0 No I eat few fruits and vegetables, or milk products 0 No I have three or more drinks of beer, liquor or wine almost every day 0 No I have tooth or mouth problems that make it hard for me to eat 0 No I don't always have enough money to buy the food I need 0 No I eat alone most of the time 0 No I take three or more different prescribed or over-the-counter drugs a day 1 Yes Without wanting to, I have lost or gained 10 pounds in the last six months 0 No I am not always physically able to shop, cook and/or feed myself 0 No Nutrition Protocols Good Risk Protocol Moderate Risk Protocol High Risk Proctocol Risk Level: Good Risk Score: 1 Electronic Signature(s) Signed: 08/20/2023 4:51:22 PM By: Terri Pulling RN, BSN Entered By: Terri Sellers on 08/20/2023 08:05:01

## 2023-08-20 NOTE — Progress Notes (Signed)
AUDRIE, KURI ([id number]) [id number] Page 1 of 6 Visit Report for 08/20/2023 Allergy List Details Patient Name: Date of Service: HO Currie Paris, Maysel 08/20/2023 10:30 A M Medical Record Number: [id number] Patient Account Number: [id number] Date of Birth/Sex: Treating RN: 1935/11/03 (88 y.o. Arta Silence Primary Care Adajah Cocking: Kathlee Nations Other Clinician: Referring Jamariah Tony: Treating Greogory Cornette/Extender: Merrily Pew in Treatment: 0 Allergies Active Allergies latex duloxetine lisinopril Allergy Notes Electronic Signature(s) Signed: 08/20/2023 4:51:22 PM By: Redmond Pulling RN, BSN Entered By: Redmond Pulling on 08/20/2023 07:55:17 -------------------------------------------------------------------------------- Arrival Information Details Patient Name: Date of Service: HO PKINS, Afua 08/20/2023 10:30 A M Medical Record Number: [id number] Patient Account Number: [id number] Date of Birth/Sex: Treating RN: 1935-05-04 (88 y.o. [name] Primary Care Dustee Bottenfield: Kathlee Nations Other Clinician: Referring Amaris Garrette: Treating Sesilia Poucher/Extender: Merrily Pew in Treatment: 0 Visit Information Patient Arrived: Ambulatory Arrival Time: 10:43 Accompanied By: daughter Transfer Assistance: None Patient Identification Verified: Yes Secondary Verification Process Completed: Yes Electronic Signature(s) Signed: 08/20/2023 4:51:22 PM By: Redmond Pulling RN, BSN Entered By: Redmond Pulling on 08/20/2023 07:54:40 Clinic Level of Care Assessment Details -------------------------------------------------------------------------------- Joseph Art ([id number]) [id number] Page 2 of 6 Patient Name: Date of Service: HO Currie Paris, Glynda 08/20/2023 10:30 A M Medical Record Number: [id number] Patient Account Number: [id number] Date of Birth/Sex: Treating RN: 1935/05/28 (88 y.o. [name] Primary Care Yarielys Beed:  Kathlee Nations Other Clinician: Referring Kenetra Hildenbrand: Treating Kazim Corrales/Extender: Merrily Pew in Treatment: 0 Clinic Level of Care Assessment Items TOOL 2 Quantity Score X- 1 0 Use when only an EandM is performed on the INITIAL visit ASSESSMENTS - Nursing Assessment / Reassessment X- 1 20 General Physical Exam (combine w/ comprehensive assessment (listed just below) when performed on new pt. evals) X- 1 25 Comprehensive Assessment (HX, ROS, Risk Assessments, Wounds Hx, etc.) ASSESSMENTS - Wound and Skin A ssessment / Reassessment []  - 0 Simple Wound Assessment / Reassessment - one wound []  - 0 Complex Wound Assessment / Reassessment - multiple wounds []  - 0 Dermatologic / Skin Assessment (not related to wound area) ASSESSMENTS - Ostomy and/or Continence Assessment and Care []  - 0 Incontinence Assessment and Management []  - 0 Ostomy Care Assessment and Management (repouching, etc.) PROCESS - Coordination of Care []  - 0 Simple Patient / Family Education for ongoing care []  - 0 Complex (extensive) Patient / Family Education for ongoing care X- 1 10 Staff obtains Chiropractor, Records, T Results / Process Orders est []  - 0 Staff telephones HHA, Nursing Homes / Clarify orders / etc []  - 0 Routine Transfer to another Facility (non-emergent condition) []  - 0 Routine Hospital Admission (non-emergent condition) X- 1 15 New Admissions / Manufacturing engineer / Ordering NPWT Apligraf, etc. , []  - 0 Emergency Hospital Admission (emergent condition) []  - 0 Simple Discharge Coordination []  - 0 Complex (extensive) Discharge Coordination PROCESS - Special Needs []  - 0 Pediatric / Minor Patient Management []  - 0 Isolation Patient Management []  - 0 Hearing / Language / Visual special needs []  - 0 Assessment of Community assistance (transportation, D/C planning, etc.) []  - 0 Additional assistance / Altered mentation []  - 0 Support Surface(s) Assessment (bed,  cushion, seat, etc.) INTERVENTIONS - Wound Cleansing / Measurement []  - 0 Wound Imaging (photographs - any number of wounds) []  - 0 Wound Tracing (instead of photographs) []  - 0 Simple Wound Measurement - one wound []  - 0 Complex Wound Measurement - multiple wounds []  - 0 Simple Wound Cleansing - one wound []  -  0 Complex Wound Cleansing - multiple wounds INTERVENTIONS - Wound Dressings []  - 0 Small Wound Dressing one or multiple wounds []  - 0 Medium Wound Dressing one or multiple wounds []  - 0 Large Wound Dressing one or multiple wounds Palermo, Marciel ([id number]) [id number] Page 3 of 6 []  - 0 Application of Medications - injection INTERVENTIONS - Miscellaneous []  - 0 External ear exam []  - 0 Specimen Collection (cultures, biopsies, blood, body fluids, etc.) []  - 0 Specimen(s) / Culture(s) sent or taken to Lab for analysis []  - 0 Patient Transfer (multiple staff / Michiel Sites Lift / Similar devices) []  - 0 Simple Staple / Suture removal (25 or less) []  - 0 Complex Staple / Suture removal (26 or more) []  - 0 Hypo / Hyperglycemic Management (close monitor of Blood Glucose) X- 1 15 Ankle / Brachial Index (ABI) - do not check if billed separately Has the patient been seen at the hospital within the last three years: Yes Total Score: 85 Level Of Care: New/Established - Level 3 Electronic Signature(s) Signed: 08/20/2023 4:51:22 PM By: Redmond Pulling RN, BSN Entered By: Redmond Pulling on 08/20/2023 08:53:26 -------------------------------------------------------------------------------- Encounter Discharge Information Details Patient Name: Date of Service: HO PKINS, Jammy 08/20/2023 10:30 A M Medical Record Number: [id number] Patient Account Number: [id number] Date of Birth/Sex: Treating RN: [birth year] (88 y.o. [name] Primary Care Pati Thinnes: Kathlee Nations Other Clinician: Referring Evee Liska: Treating Dalaya Suppa/Extender: Merrily Pew in Treatment: 0 Encounter Discharge Information Items Discharge Condition: Stable Ambulatory Status: Ambulatory Discharge Destination: Home Transportation: Private Auto Accompanied By: daughter Schedule Follow-up Appointment: Yes Clinical Summary of Care: Patient Declined Electronic Signature(s) Signed: 08/20/2023 4:51:22 PM By: Redmond Pulling RN, BSN Entered By: Redmond Pulling on 08/20/2023 08:54:13 -------------------------------------------------------------------------------- Lower Extremity Assessment Details Patient Name: Date of Service: HO PKINS, Samaa 08/20/2023 10:30 A M Medical Record Number: [id number] Patient Account Number: [id number] Date of Birth/Sex: Treating RN: May 07, 1935 (88 y.o. [name] Primary Care Benedetto Ryder: Kathlee Nations Other Clinician: Referring Haillie Radu: Treating Trimaine Maser/Extender: Merrily Pew in Treatment: 0 Edema Assessment H[Left: ESRAA, SERES ([id number])] Franne Forts: [id number] Page 4 of 6] Assessed: [Left: No] [Right: No] [Left: Edema] [Right: :] Calf Left: Right: Point of Measurement: From Medial Instep 34 cm 32 cm Ankle Left: Right: Point of Measurement: From Medial Instep 19 cm 18.5 cm Vascular Assessment Pulses: Dorsalis Pedis Palpable: [Left:Yes] [Right:Yes] Extremity colors, hair growth, and conditions: Hair Growth on Extremity: [Left:No] [Right:No] Temperature of Extremity: [Left:Cool] [Right:Cool] Blood Pressure: Brachial: [Right:176] Ankle: [Right:Dorsalis Pedis: 196 1.11] Toe Nail Assessment Left: Right: Thick: Yes Yes Discolored: Yes Yes Deformed: No No Improper Length and Hygiene: No No Electronic Signature(s) Signed: 08/20/2023 4:51:22 PM By: Redmond Pulling RN, BSN Entered By: Redmond Pulling on 08/20/2023 08:18:38 -------------------------------------------------------------------------------- Multi-Disciplinary Care Plan Details Patient Name: Date of  Service: HO PKINS, Lylith 08/20/2023 10:30 A M Medical Record Number: [id number] Patient Account Number: [id number] Date of Birth/Sex: Treating RN: 05/02/1935 (88 y.o. [name] Primary Care Aldwin Micalizzi: Kathlee Nations Other Clinician: Referring Kayte Borchard: Treating Chantea Surace/Extender: Merrily Pew in Treatment: 0 Active Inactive Electronic Signature(s) Signed: 08/20/2023 4:51:22 PM By: Redmond Pulling RN, BSN Entered By: Redmond Pulling on 08/20/2023 12:35:22 -------------------------------------------------------------------------------- Pain Assessment Details Patient Name: Date of Service: HO PKINS, Cheyenne 08/20/2023 10:30 A M Medical Record Number: [id number] Patient Account Number: [id number] Date of Birth/Sex: Treating RN: February 04, 1935 (88 y.o. [address] [place], [place] ([id number]) [id number] Page 5 of 6 Primary Care Amory Simonetti: Kathlee Nations Other Clinician: Referring Francoise Chojnowski: Treating  Gracynn Rajewski/Extender: Merrily Pew in Treatment: 0 Active Problems Location of Pain Severity and Description of Pain Patient Has Paino Yes Site Locations Duration of the Pain. Constant / Intermittento Intermittent Rate the pain. Current Pain Level: 8 Worst Pain Level: 3 Character of Pain Describe the Pain: Burning, Other: stinging Pain Management and Medication Current Pain Management: Cold Application: Yes Electronic Signature(s) Signed: 08/20/2023 4:51:22 PM By: Redmond Pulling RN, BSN Entered By: Redmond Pulling on 08/20/2023 08:19:45 -------------------------------------------------------------------------------- Patient/Caregiver Education Details Patient Name: Date of Service: HO PKINS, Josephyne 10/3/2024andnbsp10:30 A M Medical Record Number: [id number] Patient Account Number: [id number] Date of Birth/Gender: Treating RN: 08-Jun-1935 (88 y.o. [name] Primary Care Physician: Kathlee Nations Other Clinician: Referring  Physician: Treating Physician/Extender: Merrily Pew in Treatment: 0 Education Assessment Education Provided To: Patient Education Topics Provided Wound/Skin Impairment: Methods: Explain/Verbal Responses: State content correctly Electronic Signature(s) Signed: 08/20/2023 4:51:22 PM By: Redmond Pulling RN, BSN Entered By: Redmond Pulling on 08/20/2023 08:46:39 Derry, [name] ([id number]) [id number] Page 6 of 6 -------------------------------------------------------------------------------- Vitals Details Patient Name: Date of Service: HO Currie Paris, Soleia 08/20/2023 10:30 A M Medical Record Number: [id number] Patient Account Number: [id number] Date of Birth/Sex: Treating RN: 1935/09/01 (88 y.o. [name] Primary Care Bellamy Judson: Kathlee Nations Other Clinician: Referring Aaryanna Hyden: Treating Mesa Janus/Extender: Merrily Pew in Treatment: 0 Vital Signs Time Taken: 10:54 Temperature (F): 98.1 Pulse (bpm): 82 Respiratory Rate (breaths/min): 18 Blood Pressure (mmHg): 176/78 Reference Range: 80 - 120 mg / dl Electronic Signature(s) Signed: 08/20/2023 4:51:22 PM By: Redmond Pulling RN, BSN Entered By: Redmond Pulling on 08/20/2023 07:55:07

## 2023-08-21 NOTE — Progress Notes (Signed)
Terri Sellers ([id number]) [id number] Page 1 of 7 Visit Report for 08/20/2023 Chief Complaint Document Details Patient Name: Date of Service: HO Terri Sellers, Terri Sellers 08/20/2023 10:30 A M Medical Record Number: [id number] Patient Account Number: [id number] Date of Birth/Sex: Treating RN: [birth year] (88 y.o. F) Primary Care Provider: Kathlee Nations Other Clinician: Referring Provider: Treating Provider/Extender: Merrily Pew in Treatment: 0 Information Obtained from: Patient Chief Complaint 08/20/2023; Patient is here for review of painful lower extremities in the setting of venous hypertension Electronic Signature(s) Signed: 08/20/2023 5:27:16 PM By: Baltazar Najjar MD Entered By: Baltazar Najjar on 08/20/2023 10:03:13 -------------------------------------------------------------------------------- HPI Details Patient Name: Date of Service: HO Terri Sellers, Terri Sellers 08/20/2023 10:30 A M Medical Record Number: [id number] Patient Account Number: [id number] Date of Birth/Sex: Treating RN: 1935/10/27 (88 y.o. F) Primary Care Provider: Kathlee Nations Other Clinician: Referring Provider: Treating Provider/Extender: Merrily Pew in Treatment: 0 History of Present Illness HPI Description: ADMISSION 08/20/2023 This is a 88 year old woman who arrived in clinic today accompanied by her daughter. She is predominantly interested in review of painful continuous stinging and burning sensation in her bilateral lower extremities and feet. She says that this came on sometime in the fall 2023. She has known severe bilateral venous hypertension and she is been seen by vein and vascular with reflux studies on 07/13/2023 that showed significant reflux in the left lower extremity including the saphenofemoral junction, greater saphenous vein in the thigh small saphenous vein. None of this was felt to be causing her primary problem of discomfort. She also describes  episodic change in color of her feet and toes blotching discoloration in her legs but predominantly it is the burning and stinging stinging sensation in her feet that bothers her the most. I think Neurontin was suggested by her primary physician although she was scared to take it because of side effects. She is wearing compression stockings which seem to protect her skin but not really relieve her pain. She saw neurology at this point I am not sure of the neurologist. 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 Replacement of the copper did not relieve her symptoms. She did not have EMGs and nerve conduction studies ABI in our clinic was 1.1 on the right and noncompressible on the left Electronic Signature(s) Signed: 08/20/2023 5:27:16 PM By: Baltazar Najjar MD Entered By: Baltazar Najjar on 08/20/2023 10:07:37 Terri Sellers ([id number]) [id number] Page 2 of 7 -------------------------------------------------------------------------------- Physical Exam Details Patient Name: Date of Service: HO Terri Sellers, Terri Sellers 08/20/2023 10:30 A M Medical Record Number: [id number] Patient Account Number: [id number] Date of Birth/Sex: Treating RN: 1935/01/07 (88 y.o. F) Primary Care Provider: Kathlee Nations Other Clinician: Referring Provider: Treating Provider/Extender: Merrily Pew in Treatment: 0 Constitutional Patient is hypertensive.. Pulse regular and within target range for patient.Marland Kitchen Respirations regular, non-labored and within target range.. Temperature is normal and within the target range for the patient.Marland Kitchen Appears in no distress. Cardiovascular Pedal pulses are palpable bilaterally. Dilated veins in the lower extremities ankles and feet but without significant edema or  erythema.. Notes Wound exam; the patient has no open wound Electronic Signature(s) Signed: 08/20/2023 5:27:16 PM By: Baltazar Najjar MD Entered By: Baltazar Najjar on 08/20/2023 10:06:42 -------------------------------------------------------------------------------- Physician Orders Details Patient Name: Date of Service: HO Terri Sellers, Terri Sellers 08/20/2023 10:30 A M Medical Record Number: [id number] Patient  Account Number: [id number] Date of Birth/Sex: Treating RN: 08-Apr-1935 (88 y.o. [name] Primary Care Provider: Kathlee Nations Other Clinician: Referring Provider: Treating Provider/Extender: Merrily Pew in Treatment: 0 Verbal / Phone Orders: No Diagnosis Coding Discharge From [facility] Services Discharge from Wound Care Center - Thank you for visiting the wound center. If you notice any open areas please call wound center. Edema Control - Lymphedema / SCD / Other Bilateral Lower Extremities Avoid standing for long periods of time. Patient to wear own compression stockings every day. Moisturize legs daily. Electronic Signature(s) Signed: 08/20/2023 4:51:22 PM By: Redmond Pulling RN, BSN Signed: 08/20/2023 5:27:16 PM By: Baltazar Najjar MD Entered By: Redmond Pulling on 08/20/2023 08:50:11 Problem List Details -------------------------------------------------------------------------------- Terri Sellers ([id number]) [id number] Page 3 of 7 Patient Name: Date of Service: HO Terri Sellers, Terri Sellers 08/20/2023 10:30 A M Medical Record Number: [id number] Patient Account Number: [id number] Date of Birth/Sex: Treating RN: 1935/07/11 (88 y.o. F) Primary Care Provider: Kathlee Nations Other Clinician: Referring Provider: Treating Provider/Extender: Merrily Pew in Treatment: 0 Active Problems ICD-10 Encounter Code Description Active Date MDM Diagnosis I87.313 Chronic venous hypertension (idiopathic) with ulcer of bilateral lower extremity  08/20/2023 No Yes M79.669 Pain in unspecified lower leg 08/20/2023 No Yes Inactive Problems Resolved Problems Electronic Signature(s) Signed: 08/20/2023 5:27:16 PM By: Baltazar Najjar MD Entered By: Baltazar Najjar on 08/20/2023 09:54:41 -------------------------------------------------------------------------------- Progress Note Details Patient Name: Date of Service: HO Terri Sellers, Terri Sellers 08/20/2023 10:30 A M Medical Record Number: [id number] Patient Account Number: [id number] Date of Birth/Sex: Treating RN: 06-Oct-1935 (88 y.o. F) Primary Care Provider: Kathlee Nations Other Clinician: Referring Provider: Treating Provider/Extender: Merrily Pew in Treatment: 0 Subjective Chief Complaint Information obtained from Patient 08/20/2023; Patient is here for review of painful lower extremities in the setting of venous hypertension History of Present Illness (HPI) ADMISSION 08/20/2023 This is a 88 year old woman who arrived in clinic today accompanied by her daughter. She is predominantly interested in review of painful continuous stinging and burning sensation in her bilateral lower extremities and feet. She says that this came on sometime in the fall 2023. She has known severe bilateral venous hypertension and she is been seen by vein and vascular with reflux studies on 07/13/2023 that showed significant reflux in the left lower extremity including the saphenofemoral junction, greater saphenous vein in the thigh small saphenous vein. None of this was felt to be causing her primary problem of discomfort. She also describes episodic change in color of her feet and toes blotching discoloration in her legs but predominantly it is the burning and stinging stinging sensation in her feet that bothers her the most. I think Neurontin was suggested by her primary physician although she was scared to take it because of side effects. She is wearing compression stockings which seem to protect her  skin but not really relieve her pain. She saw neurology at this point I am not sure of the neurologist. 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 Replacement of the copper did not relieve her symptoms. She did not have EMGs and nerve conduction studies ABI in our clinic was 1.1 on the right and noncompressible on the left Patient History Information obtained from Chart. Allergies latex, duloxetine, lisinopril Mera, Richardine ([id number]) [id number] Page 4 of 7 Family History Cancer - Mother,  Heart Disease - Father. Social History Never smoker, Marital Status - Married, Alcohol Use - Never, Drug Use - No History, Caffeine Use - Daily. Medical History Eyes Patient has history of Glaucoma Cardiovascular Patient has history of Hypertension Neurologic Patient has history of Neuropathy Oncologic Patient has history of Received Chemotherapy - 2014, Received Radiation - 2014 Hospitalization/Surgery History - right breast lumpectomy. - tonsiliectomy. - hysterectomy. Medical A Surgical History Notes nd Gastrointestinal Gerd Psychiatric anxiety Review of Systems (ROS) Constitutional Symptoms (General Health) Denies complaints or symptoms of Fatigue, Fever, Chills, Marked Weight Change. Ear/Nose/Mouth/Throat Denies complaints or symptoms of Chronic sinus problems or rhinitis. Respiratory Denies complaints or symptoms of Chronic or frequent coughs, Shortness of Breath. Endocrine Denies complaints or symptoms of Heat/cold intolerance. Genitourinary Denies complaints or symptoms of Frequent urination. Integumentary (Skin) Complains or has symptoms of Wounds - right posteriorLE. Musculoskeletal Denies complaints or symptoms of Muscle Pain, Muscle Weakness. Oncologic breast ca  2014 Objective Constitutional Patient is hypertensive.. Pulse regular and within target range for patient.Marland Kitchen Respirations regular, non-labored and within target range.. Temperature is normal and within the target range for the patient.Marland Kitchen Appears in no distress. Vitals Time Taken: 10:54 AM, Temperature: 98.1 F, Pulse: 82 bpm, Respiratory Rate: 18 breaths/min, Blood Pressure: 176/78 mmHg. Cardiovascular Pedal pulses are palpable bilaterally. Dilated veins in the lower extremities ankles and feet but without significant edema or erythema.. General Notes: Wound exam; the patient has no open wound Assessment Active Problems ICD-10 Chronic venous hypertension (idiopathic) with ulcer of bilateral lower extremity Pain in unspecified lower leg Plan Discharge From [facility] Services: Discharge from Wound Care Center - Thank you for visiting the wound center. If you notice any open areas please call wound center. Edema Control - Lymphedema / SCD / Other: Avoid standing for long periods of time. Patient to wear own compression stockings every day. Terri Sellers, Terri Sellers ([id number]) [id number] Page 5 of 7 Moisturize legs daily. 1. The patient has significant venous hypertension and has had some bleeding vessels I think on 2 different occasions in the past but currently she has no open wounds and I agree that this is no reason for the burning and stinging sensation she describes. She is not a candidate for venous ablation because her vessels are apparently too small 2. The pain she has in her lower extremities certainly sounds neuropathic. She has been to see a neurologist who felt that she did have a neuropathy although I have not specifically looked up this note. I was not aware that copper deficiency can cause a neuropathy but replacement of the copper according to the patient has not relieved her symptoms 3. I do not believe that we can help this patient here. She is certainly at risk  for developing wounds down the road so I have suggested continue with her 20/30 below-knee stockings. 4. I have encouraged her to consider oral therapy for her pain. She is very fearful about side effects. She used a compounding topical cream ordered by her primary doctor but that is not having an effect either. Electronic Signature(s) Signed: 08/20/2023 5:27:16 PM By: Baltazar Najjar MD Entered By: Baltazar Najjar on 08/20/2023 10:10:14 -------------------------------------------------------------------------------- HxROS Details Patient Name: Date of Service: HO Terri Sellers, Terri Sellers 08/20/2023 10:30 A M Medical Record Number: [id number] Patient Account Number: [id number] Date of Birth/Sex: Treating RN: 13-Jul-1935 (88 y.o. Arta Silence Primary Care Provider: Kathlee Nations Other Clinician: Referring Provider: Treating Provider/Extender: Merrily Pew in Treatment: 0 Information Obtained From Chart Constitutional Symptoms (General Health)  Complaints and Symptoms: Negative for: Fatigue; Fever; Chills; Marked Weight Change Ear/Nose/Mouth/Throat Complaints and Symptoms: Negative for: Chronic sinus problems or rhinitis Respiratory Complaints and Symptoms: Negative for: Chronic or frequent coughs; Shortness of Breath Endocrine Complaints and Symptoms: Negative for: Heat/cold intolerance Genitourinary Complaints and Symptoms: Negative for: Frequent urination Integumentary (Skin) Complaints and Symptoms: Positive for: Wounds - right posteriorLE Musculoskeletal Complaints and Symptoms: Negative for: Muscle Pain; Muscle Weakness Eyes Medical HistoryHEIRESS, Terri Sellers ([id number]) [phone].pdf Page 6 of 7 Positive for: Glaucoma Hematologic/Lymphatic Cardiovascular Medical History: Positive for: Hypertension Gastrointestinal Medical History: Past Medical History Notes: Gerd Immunological Neurologic Medical History: Positive for:  Neuropathy Oncologic Complaints and Symptoms: Review of System Notes: breast ca 2014 Medical History: Positive for: Received Chemotherapy - 2014; Received Radiation - 2014 Psychiatric Medical History: Past Medical History Notes: anxiety HBO Extended History Items Eyes: Glaucoma Immunizations Pneumococcal Vaccine: Received Pneumococcal Vaccination: Yes Received Pneumococcal Vaccination On or After 60th Birthday: Yes Implantable Devices None Hospitalization / Surgery History Type of Hospitalization/Surgery right breast lumpectomy tonsiliectomy hysterectomy Family and Social History Cancer: Yes - Mother; Heart Disease: Yes - Father; Never smoker; Marital Status - Married; Alcohol Use: Never; Drug Use: No History; Caffeine Use: Daily; Financial Concerns: No; Food, Clothing or Shelter Needs: No; Support System Lacking: No; Transportation Concerns: No Psychologist, prison and probation services) Signed: 08/20/2023 4:51:22 PM By: Redmond Pulling RN, BSN Signed: 08/20/2023 5:14:49 PM By: Shawn Stall RN, BSN Signed: 08/20/2023 5:27:16 PM By: Baltazar Najjar MD Entered By: Redmond Pulling on 08/20/2023 08:02:30 SuperBill Details -------------------------------------------------------------------------------- Terri Sellers ([id number]) [id number] Page 7 of 7 Patient Name: Date of Service: Terri Sellers, Terri Sellers 08/20/2023 Medical Record Number: [id number] Patient Account Number: [id number] Date of Birth/Sex: Treating RN: Jan 29, 1935 (88 y.o. [name] Primary Care Provider: Kathlee Nations Other Clinician: Referring Provider: Treating Provider/Extender: Merrily Pew in Treatment: 0 Diagnosis Coding Facility Procedures CPT4 Code Description Modifier Quantity [id number] [phone] - WOUND CARE VISIT-LEV 3 EST PT 1 Electronic Signature(s) Signed: 08/20/2023 4:51:22 PM By: Redmond Pulling RN, BSN Signed: 08/20/2023 5:27:16 PM By: Baltazar Najjar MD Entered By: Redmond Pulling on 08/20/2023 08:53:34

## 2023-09-04 ENCOUNTER — Ambulatory Visit: Payer: Medicare Other

## 2024-11-02 ENCOUNTER — Ambulatory Visit: Admitting: Cardiology

## 2024-11-02 ENCOUNTER — Other Ambulatory Visit: Payer: Self-pay | Admitting: Neurology

## 2024-11-02 ENCOUNTER — Encounter: Payer: Self-pay | Admitting: Cardiology

## 2024-11-02 VITALS — BP 142/60 | HR 82 | Ht 61.0 in | Wt 124.0 lb

## 2024-11-02 DIAGNOSIS — G629 Polyneuropathy, unspecified: Secondary | ICD-10-CM | POA: Insufficient documentation

## 2024-11-02 DIAGNOSIS — I872 Venous insufficiency (chronic) (peripheral): Secondary | ICD-10-CM

## 2024-11-02 DIAGNOSIS — I83893 Varicose veins of bilateral lower extremities with other complications: Secondary | ICD-10-CM | POA: Insufficient documentation

## 2024-11-02 DIAGNOSIS — I10 Essential (primary) hypertension: Secondary | ICD-10-CM | POA: Diagnosis present

## 2024-11-02 NOTE — Patient Instructions (Signed)
 Medication Instructions:  No changes *If you need a refill on your cardiac medications before your next appointment, please call your pharmacy*  Lab Work: None If you have labs (blood work) drawn today and your tests are completely normal, you will receive your results only by: MyChart Message (if you have MyChart) OR A paper copy in the mail If you have any lab test that is abnormal or we need to change your treatment, we will call you to review the results.  Testing/Procedures: None  Follow-Up: At [facility], you and your health needs are our priority.  As part of our continuing mission to provide you with exceptional heart care, our providers are all part of one team.  This team includes your primary Cardiologist (physician) and Advanced Practice Providers or APPs (Physician Assistants and Nurse Practitioners) who all work together to provide you with the care you need, when you need it.  Your next appointment:   As needed    We recommend signing up for the patient portal called MyChart.  Sign up information is provided on this After Visit Summary.  MyChart is used to connect with patients for Virtual Visits (Telemedicine).  Patients are able to view lab/test results, encounter notes, upcoming appointments, etc.  Non-urgent messages can be sent to your provider as well.   To learn more about what you can do with MyChart, go to [email or web address].   Other Instructions  Please make an appt with Dr. Serene for earliest available.

## 2024-11-02 NOTE — Progress Notes (Signed)
 Cardiology Office Note:  .   Date:  11/02/2024  ID:  Terri Sellers, DOB Dec 01, 1934, MRN [id number] PCP: Graydon Mt, MD  [facility] Providers Cardiologist:  Newman Lawrence, MD PCP: Graydon Mt, MD  Chief Complaint  Patient presents with   Venous Insufficiency     Terri Sellers is a 89 y.o. female with hypertension, venous insufficiency, polyneuropathy  Discussed the use of AI scribe software for clinical note transcription with the patient, who gave verbal consent to proceed.  History of Present Illness Terri Sellers is an 89 year old female with hypertension and neuropathy who presents with concerns about leg symptoms and blood pressure management.  She has bilateral tingling, stinging, and numbness in the feet, worse in the left leg. Toes are almost completely numb. She has discomfort without significant swelling or itching and has known varicose veins and abnormal left-leg venous ultrasound from August 2024.  She has neuropathy with prior copper  deficiency and declined gabapentin. Neurologic treatments to date have not improved symptoms.  She takes losartan 25 mg twice daily and amlodipine 2.5 mg at night for hypertension that was previously elevated at night. Recent blood pressures are about 150/46 with daytime readings sometimes 130/60. She has no chest pain, shortness of breath, lightheadedness, or syncope.  She is active with church and household tasks but does not walk regularly for exercise.      Vitals:   11/02/24 1340  BP: (!) 142/60  Pulse: 82  SpO2: 99%      Review of Systems  Cardiovascular:  Negative for chest pain, dyspnea on exertion, leg swelling, palpitations and syncope.       Varicose veins  Neurological:  Positive for numbness and paresthesias.        Studies Reviewed: Terri Sellers        EKG 11/02/2024: Normal sinus rhythm Normal ECG When compared with ECG of 15-Aug-2022 18:51, No significant change was found    Labs 08/2024: Chol  167, TG 82, HDL 75, LDL 76 Hb 11 TSH 1.3   Physical Exam Vitals and nursing note reviewed.  Constitutional:      General: She is not in acute distress. Neck:     Vascular: No JVD.  Cardiovascular:     Rate and Rhythm: Normal rate and regular rhythm.     Pulses: Intact distal pulses.     Heart sounds: Murmur heard.     Harsh midsystolic murmur is present with a grade of 1/6 at the upper right sternal border radiating to the neck.  Pulmonary:     Effort: Pulmonary effort is normal.     Breath sounds: Normal breath sounds. No wheezing or rales.  Musculoskeletal:     Right lower leg: No edema.     Left lower leg: No edema.  Skin:    Comments: Prominent varicosities left >right leg      VISIT DIAGNOSES:   ICD-10-CM   1. Primary hypertension  I10 EKG 12-Lead    2. Varicose veins of bilateral lower extremities with other complications  I83.893     3. Polyneuropathy  G62.9        Terri Sellers is a 89 y.o. female with hypertension, venous insufficiency, polyneuropathy  Assessment & Plan Varicose veins of lower extremities with venous insufficiency Primary varicosities left present and right leg. 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   Previously seen in vascular surgery clinic.  Patient is currently wearing compression stockings and keeping her legs elevated at night. I do not see any venous  ulcers, edema, or severe venous dermatitis. At patient's request, I will refer her back to vascular surgery clinic, requested to see physician.  I recommended seeing Dr. Serene.  I explained to her that treatment of varicose veins is unlikely to improve her tingling, numbness.  Polyneuropathy: Tingling and numbness in both feet. Patient was previously seen by Dr. Pamela in 01/2023. I recommended that she reestablishes with them to reassess her symptoms.  In the past, she is noted not to be on gabapentin.  Defer further management recommendations to Dr. Vear.  Primary  hypertension: Blood pressure readings range from 150-161/76-79. Current medication includes losartan and amlodipine. No acute cardiovascular issues noted. Patient feels that her hypertension is driven by her anxiety related to her neuropathy and varicose vein symptoms and is not keen on changing any medications at this time. Continue follow-up with PCP.    F/u as needed  Signed, Newman Terri Sellers Lawrence, MD

## 2024-11-03 ENCOUNTER — Telehealth: Payer: Self-pay | Admitting: Neurology

## 2024-11-03 NOTE — Telephone Encounter (Signed)
 Referral To Vascular Surgery Sent Thru Epic to [facility] Phone :  [phone]

## 2024-11-07 ENCOUNTER — Telehealth: Payer: Self-pay | Admitting: Neurology

## 2024-11-07 NOTE — Telephone Encounter (Signed)
 Appointment made for worsening symptoms

## 2024-11-18 ENCOUNTER — Encounter: Payer: Self-pay | Admitting: Cardiology

## 2025-02-21 ENCOUNTER — Ambulatory Visit: Admitting: Neurology

## 2025-06-29 ENCOUNTER — Ambulatory Visit: Admitting: Neurology
